# Patient Record
Sex: Male | Born: 1980 | Race: White | Hispanic: No | Smoking: Never smoker
Health system: Southern US, Community
[De-identification: ages and names within clinical notes are randomized; demographics above are authoritative.]

## PROBLEM LIST (undated history)

## (undated) DIAGNOSIS — F988 Other specified behavioral and emotional disorders with onset usually occurring in childhood and adolescence: Secondary | ICD-10-CM

## (undated) HISTORY — DX: Other specified behavioral and emotional disorders with onset usually occurring in childhood and adolescence: F98.8

## (undated) HISTORY — PX: APPENDECTOMY: SHX54

---

## 2004-11-14 ENCOUNTER — Ambulatory Visit: Payer: Self-pay | Admitting: Internal Medicine

## 2004-11-14 ENCOUNTER — Inpatient Hospital Stay (HOSPITAL_COMMUNITY): Admission: RE | Admit: 2004-11-14 | Discharge: 2004-11-22 | Payer: Self-pay | Admitting: *Deleted

## 2004-11-14 ENCOUNTER — Encounter (INDEPENDENT_AMBULATORY_CARE_PROVIDER_SITE_OTHER): Payer: Self-pay | Admitting: *Deleted

## 2004-11-14 ENCOUNTER — Encounter: Admission: RE | Admit: 2004-11-14 | Discharge: 2004-11-14 | Payer: Self-pay | Admitting: Internal Medicine

## 2004-11-28 ENCOUNTER — Ambulatory Visit (HOSPITAL_COMMUNITY): Admission: RE | Admit: 2004-11-28 | Discharge: 2004-11-28 | Payer: Self-pay | Admitting: *Deleted

## 2005-01-06 ENCOUNTER — Ambulatory Visit: Payer: Self-pay | Admitting: Internal Medicine

## 2005-07-08 IMAGING — CT CT PELVIS W/ CM
1 of 4 series · 13 of 32 positions shown, 18 images · IV contrast (omnipaque)
Comparison: 11/21/04.

CLINICAL DATA: 23 year-old male status post appendectomy with abdominal and pelvic abscesses.  Percutaneous drainage procedure was performed on 11/21/04. The patient returns for follow-up scan and re-evaluation.
TECHNIQUE: 150 cc Omnipaque 300 contrast was administered intravenously.  Helical imaging performed through the abdomen and pelvis.
CT ABDOMEN WITH CONTRAST:
Bibasilar atelectasis is noted.  No pericardial or pleural fluid.  Heart size is normal.  Liver, gallbladder, spleen, pancreas, biliary system, and kidneys are normal. Adrenal glands are also within normal limits.  There appears to be a stable calcification along the adrenal gland and IVC on image 36. Previous right upper quadrant perihepatic fluid collection has significantly decreased in diameter.  This roughly measures 20 x 17 mm, previously measuring 26 x 44 mm. Adjacent to the duodenum, there is a mesenteric focal fluid collection with peripheral enhancement now measuring 31 mm, previously measuring 29 mm.  Beneath the right rectus abdominus muscle, there is slight enlargement of a peritoneal peripheral enhancing fluid collection adjacent to the colon now measuring 27 x 25 mm, previously measuring 26 x 14 mm. Residual inflammation is noted in the right lower quadrant about the cecum.  Small amount of fluid adjacent to the surgical clips appears to be resolving as well and measures 7 mm, previously measuring 28 mm.
The right lower quadrant anterior pigtail drainage catheter is again noted.  There is no residual fluid about the catheter tip.

[Series 2: abd/pelvis 5.0 b30f · axial · 0.66mm/px · z∈[-568,-118]mm · 13 of 104 slices shown, 18 images]
[im 7/104  soft-tissue]
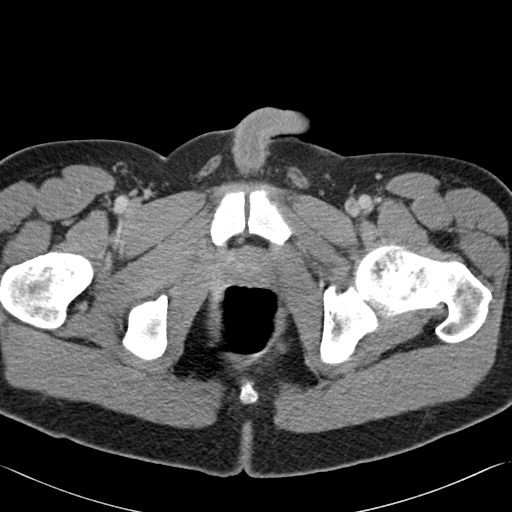
[im 7/104  bone]
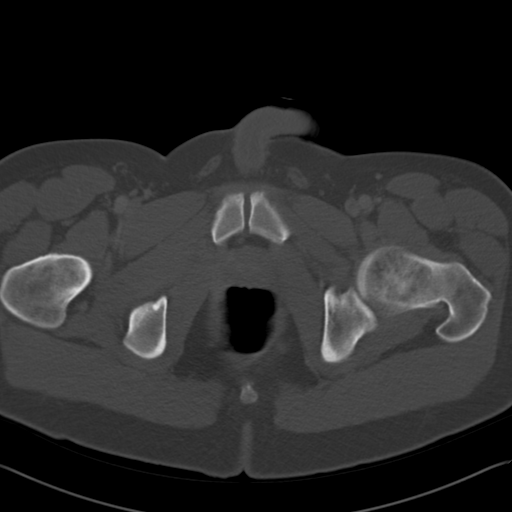
[im 14/104  soft-tissue]
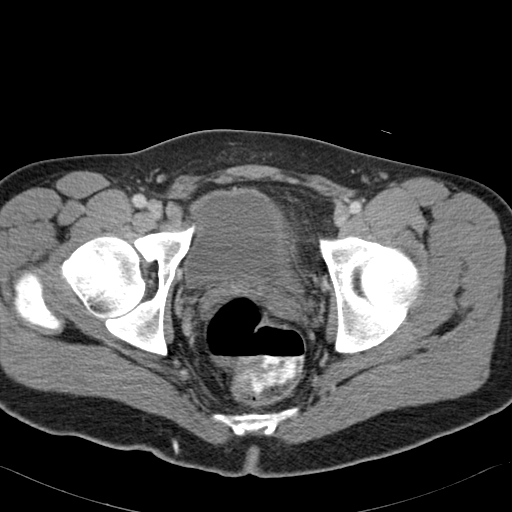
[im 21/104  soft-tissue]
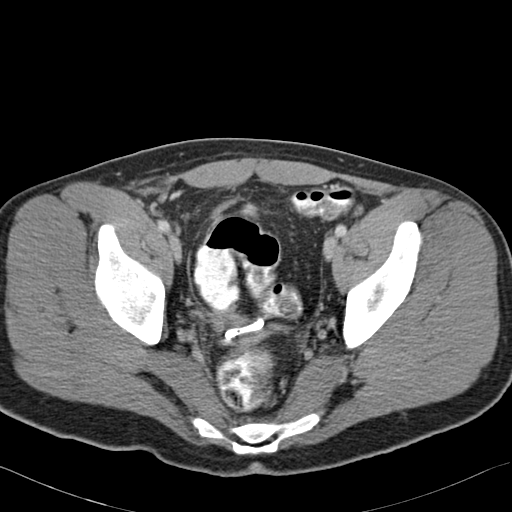
[im 35/104  soft-tissue]
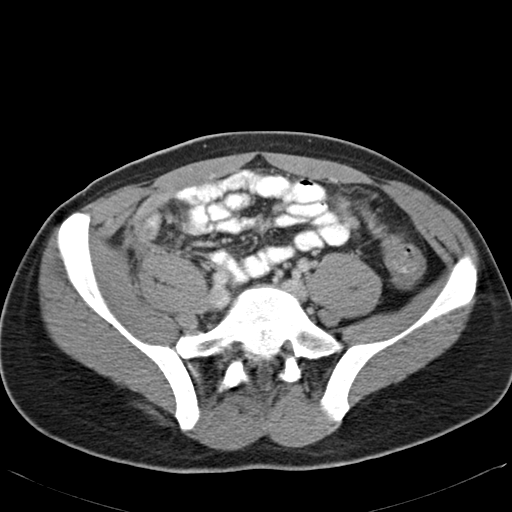
[im 42/104  soft-tissue]
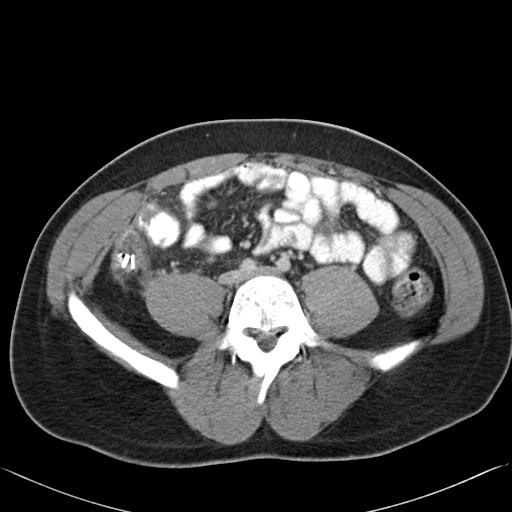
[im 49/104  soft-tissue]
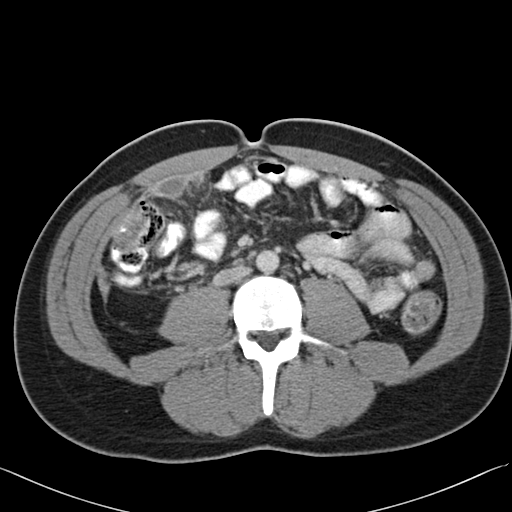
[im 55/104  soft-tissue]
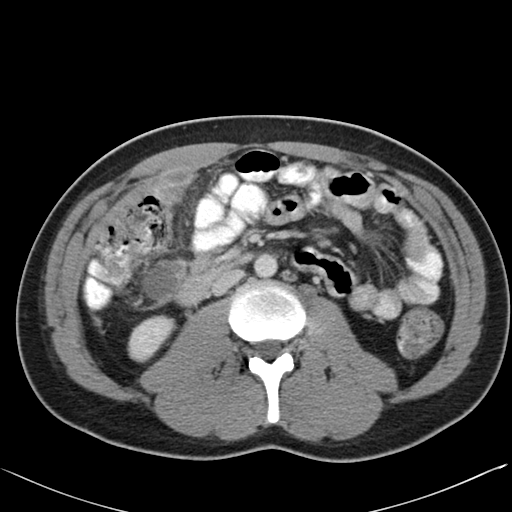
[im 62/104  soft-tissue]
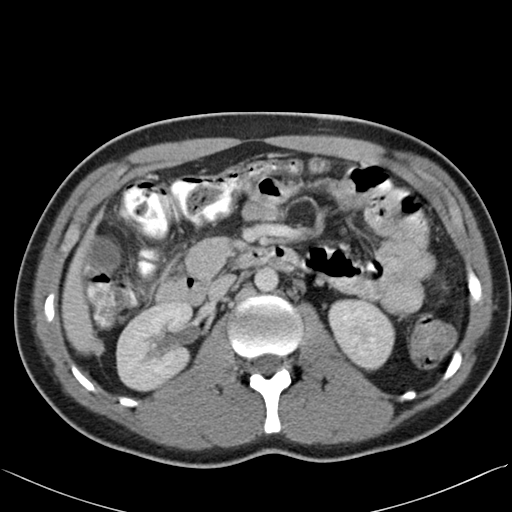
[im 69/104  soft-tissue]
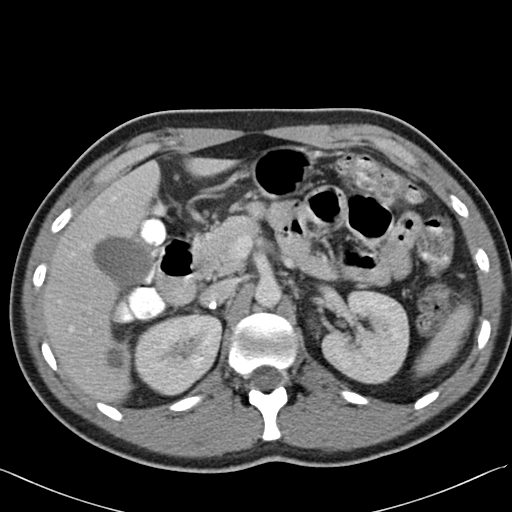
[im 69/104  bone]
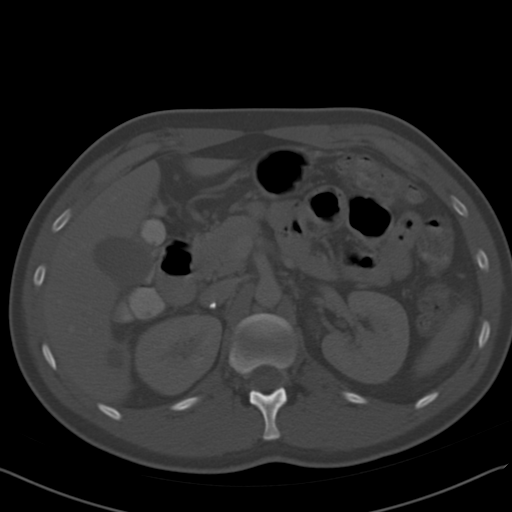
[im 76/104  lung]
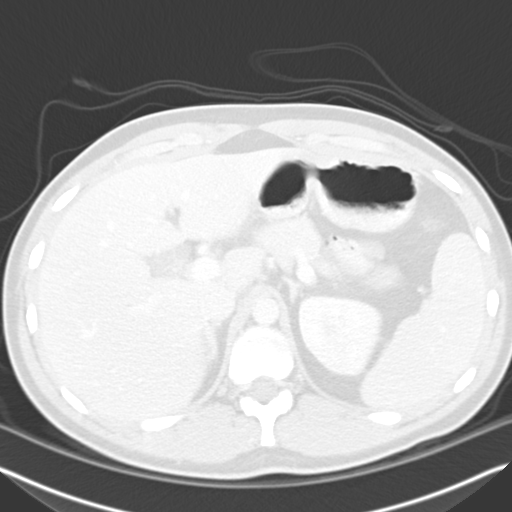
[im 83/104  soft-tissue]
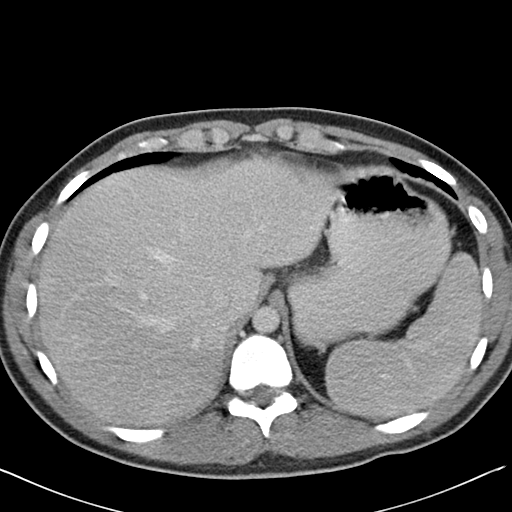
[im 83/104  lung]
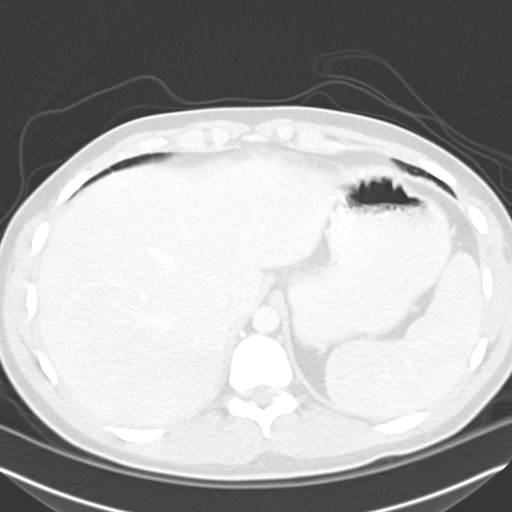
[im 90/104  soft-tissue]
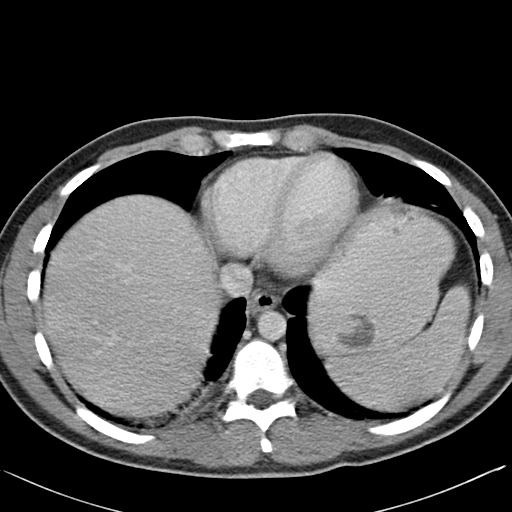
[im 90/104  lung]
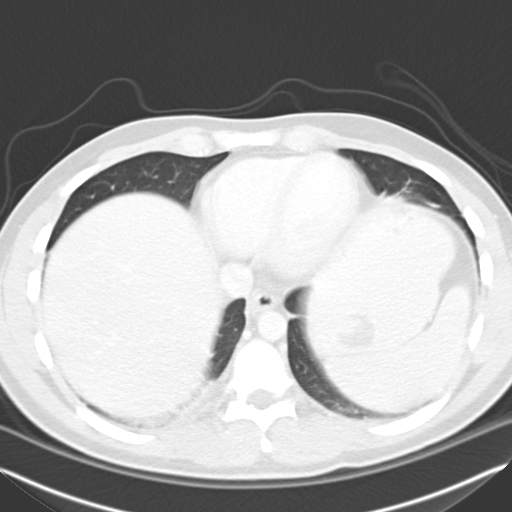
[im 97/104  soft-tissue]
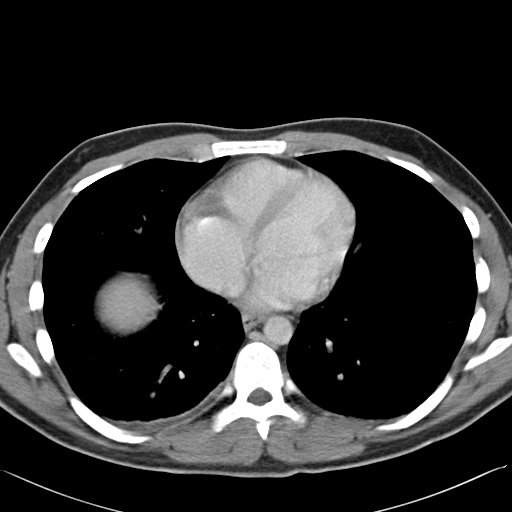
[im 97/104  lung]
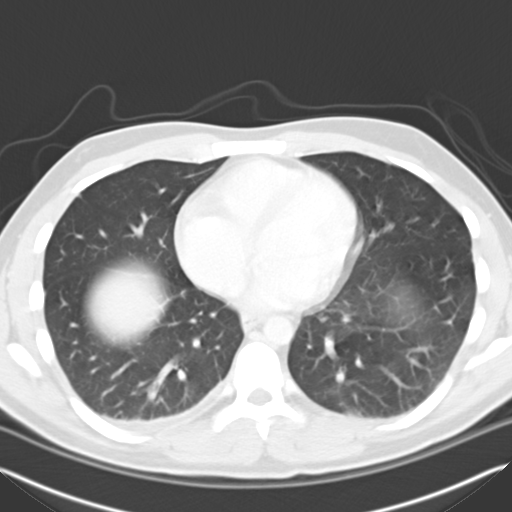

[13 of 32 positions shown; findings below may reference images not displayed]

IMPRESSION: 1.  Resolved right lower quadrant abscess following drainage catheter insertion. This drainage catheter will be removed.  
2.  Improving right inferior perihepatic fluid collection.
3.  Slight enlargement of two right abdominal fluid collections as described.
CT PELVIS WITH CONTRAST:
The right posterior transgluteal pigtail catheter is in place. There is minimal if any residual fluid in the previous pelvic collection.  Inflammation about the rectosigmoid colon and bladder has nearly resolved.  No new pelvic fluid collections.
IMPRESSION: Resolved pelvic abscess and improving adjacent inflammation.
Note: The output from both catheters has significantly decreased over the last 48 to 72 hours.  After discussion with Dr. Sandip, the two abscess drains will be removed.
Because the two right abdominal fluid collections have slightly increased in size, the patient?s white count will be checked, and if greater than 10, these will be aspirated with CT guidance.

## 2005-08-29 ENCOUNTER — Ambulatory Visit: Payer: Self-pay | Admitting: Internal Medicine

## 2006-06-08 ENCOUNTER — Ambulatory Visit: Payer: Self-pay | Admitting: Internal Medicine

## 2006-07-18 ENCOUNTER — Ambulatory Visit: Payer: Self-pay | Admitting: Internal Medicine

## 2007-04-04 ENCOUNTER — Encounter: Payer: Self-pay | Admitting: Internal Medicine

## 2007-04-04 ENCOUNTER — Telehealth (INDEPENDENT_AMBULATORY_CARE_PROVIDER_SITE_OTHER): Payer: Self-pay | Admitting: *Deleted

## 2007-04-15 ENCOUNTER — Telehealth: Payer: Self-pay | Admitting: Internal Medicine

## 2007-06-10 ENCOUNTER — Ambulatory Visit: Payer: Self-pay | Admitting: Internal Medicine

## 2007-06-10 DIAGNOSIS — F988 Other specified behavioral and emotional disorders with onset usually occurring in childhood and adolescence: Secondary | ICD-10-CM

## 2007-07-29 ENCOUNTER — Telehealth (INDEPENDENT_AMBULATORY_CARE_PROVIDER_SITE_OTHER): Payer: Self-pay | Admitting: *Deleted

## 2007-10-29 ENCOUNTER — Telehealth (INDEPENDENT_AMBULATORY_CARE_PROVIDER_SITE_OTHER): Payer: Self-pay | Admitting: *Deleted

## 2007-12-04 ENCOUNTER — Telehealth (INDEPENDENT_AMBULATORY_CARE_PROVIDER_SITE_OTHER): Payer: Self-pay | Admitting: *Deleted

## 2008-01-13 ENCOUNTER — Telehealth (INDEPENDENT_AMBULATORY_CARE_PROVIDER_SITE_OTHER): Payer: Self-pay | Admitting: *Deleted

## 2008-03-09 ENCOUNTER — Ambulatory Visit: Payer: Self-pay | Admitting: Internal Medicine

## 2008-04-20 ENCOUNTER — Telehealth (INDEPENDENT_AMBULATORY_CARE_PROVIDER_SITE_OTHER): Payer: Self-pay | Admitting: *Deleted

## 2008-05-29 ENCOUNTER — Telehealth (INDEPENDENT_AMBULATORY_CARE_PROVIDER_SITE_OTHER): Payer: Self-pay | Admitting: *Deleted

## 2008-07-13 ENCOUNTER — Telehealth (INDEPENDENT_AMBULATORY_CARE_PROVIDER_SITE_OTHER): Payer: Self-pay | Admitting: *Deleted

## 2008-08-28 ENCOUNTER — Telehealth (INDEPENDENT_AMBULATORY_CARE_PROVIDER_SITE_OTHER): Payer: Self-pay | Admitting: *Deleted

## 2008-10-13 ENCOUNTER — Telehealth (INDEPENDENT_AMBULATORY_CARE_PROVIDER_SITE_OTHER): Payer: Self-pay | Admitting: *Deleted

## 2008-12-09 ENCOUNTER — Telehealth (INDEPENDENT_AMBULATORY_CARE_PROVIDER_SITE_OTHER): Payer: Self-pay | Admitting: *Deleted

## 2008-12-15 ENCOUNTER — Ambulatory Visit: Payer: Self-pay | Admitting: Internal Medicine

## 2009-02-17 ENCOUNTER — Telehealth (INDEPENDENT_AMBULATORY_CARE_PROVIDER_SITE_OTHER): Payer: Self-pay | Admitting: *Deleted

## 2009-04-02 ENCOUNTER — Telehealth (INDEPENDENT_AMBULATORY_CARE_PROVIDER_SITE_OTHER): Payer: Self-pay | Admitting: *Deleted

## 2009-05-13 ENCOUNTER — Telehealth (INDEPENDENT_AMBULATORY_CARE_PROVIDER_SITE_OTHER): Payer: Self-pay | Admitting: *Deleted

## 2009-06-18 ENCOUNTER — Telehealth (INDEPENDENT_AMBULATORY_CARE_PROVIDER_SITE_OTHER): Payer: Self-pay | Admitting: *Deleted

## 2009-07-26 ENCOUNTER — Telehealth (INDEPENDENT_AMBULATORY_CARE_PROVIDER_SITE_OTHER): Payer: Self-pay | Admitting: *Deleted

## 2009-08-23 ENCOUNTER — Ambulatory Visit: Payer: Self-pay | Admitting: Internal Medicine

## 2010-01-07 ENCOUNTER — Telehealth (INDEPENDENT_AMBULATORY_CARE_PROVIDER_SITE_OTHER): Payer: Self-pay | Admitting: *Deleted

## 2010-02-14 ENCOUNTER — Telehealth (INDEPENDENT_AMBULATORY_CARE_PROVIDER_SITE_OTHER): Payer: Self-pay | Admitting: *Deleted

## 2010-03-28 ENCOUNTER — Telehealth (INDEPENDENT_AMBULATORY_CARE_PROVIDER_SITE_OTHER): Payer: Self-pay | Admitting: *Deleted

## 2010-04-11 ENCOUNTER — Ambulatory Visit: Payer: Self-pay | Admitting: Internal Medicine

## 2010-06-20 ENCOUNTER — Telehealth (INDEPENDENT_AMBULATORY_CARE_PROVIDER_SITE_OTHER): Payer: Self-pay | Admitting: *Deleted

## 2010-08-09 ENCOUNTER — Telehealth: Payer: Self-pay | Admitting: Internal Medicine

## 2010-08-23 ENCOUNTER — Telehealth (INDEPENDENT_AMBULATORY_CARE_PROVIDER_SITE_OTHER): Payer: Self-pay | Admitting: *Deleted

## 2010-11-08 ENCOUNTER — Telehealth: Payer: Self-pay | Admitting: Internal Medicine

## 2010-11-15 ENCOUNTER — Telehealth: Payer: Self-pay | Admitting: Internal Medicine

## 2010-11-22 NOTE — Progress Notes (Signed)
Summary: Refill Request  Phone Note Refill Request Call back at Home Phone [phone] Message from:  Patient on February 14, 2010 12:56 PM  Refills Requested: Medication #1:  ADDERALL 30 MG  TABS Take one tablet twice daily..   Dosage confirmed as above?Dosage Confirmed   Supply Requested: 1 month  Method Requested: Pick up at Office Next Appointment Scheduled: none Initial call taken by: Harold Barban,  February 14, 2010 12:57 PM    Prescriptions: ADDERALL 30 MG  TABS (AMPHETAMINE-DEXTROAMPHETAMINE) Take one tablet twice daily.  #60 x 0   Entered by:   Shary Decamp   Authorized by:   Nolon Rod. Paz MD   Signed by:   Shary Decamp on 02/14/2010   Method used:   Print then Give to Patient   RxID:   [id number]

## 2010-11-22 NOTE — Progress Notes (Signed)
Summary: REFILL  Phone Note Refill Request Call back at [phone] Message from:  Patient on August 09, 2010 4:05 PM  Refills Requested: Medication #1:  ADDERALL 30 MG  TABS Take one tablet twice daily..  Method Requested: Pick up at Office Initial call taken by: Magdalen Spatz [facility],  August 09, 2010 4:05 PM Caller: Patient Call For: Nolon Rod. Mairen Wallenstein MD  Follow-up for Phone Call        Pt is aware rx is ready.  Follow-up by: Army Fossa CMA,  August 09, 2010 4:07 PM    Prescriptions: ADDERALL 30 MG  TABS (AMPHETAMINE-DEXTROAMPHETAMINE) Take one tablet twice daily.  #60 x 0   Entered by:   Army Fossa CMA   Authorized by:   Nolon Rod. Kurstin Dimarzo MD   Signed by:   Army Fossa CMA on 08/09/2010   Method used:   Print then Give to Patient   RxID:   [id number]

## 2010-11-22 NOTE — Assessment & Plan Note (Signed)
Summary: FOR MED REFILL--PH   Vital Signs:  Patient profile:   30 year old male Height:      72 inches Weight:      212 pounds Temp:     99.3 degrees F oral Pulse rate:   66 / minute BP sitting:   140 / 78  (left arm)  Vitals Entered By: Jeremy Johann CMA (April 11, 2010 4:22 PM) CC: f/u med Comments --refill REVIEWED MED LIST, PATIENT AGREED DOSE AND INSTRUCTION CORRECT    History of Present Illness: followup on ADHD, doing well  Allergies (verified): No Known Drug Allergies  Past History:  Past Medical History: Reviewed history from 03/09/2008 and no changes required. ADD  Past Surgical History: Reviewed history from 06/10/2007 and no changes required. 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 He feels that he needs it even outside the office for instance when he plays golf .  Physical Exam  General:  alert and well-developed.   Lungs:  normal respiratory effort, no intercostal retractions, no accessory muscle use, and normal breath sounds.   Heart:  normal rate, regular rhythm, and no murmur.   Psych:  not anxious appearing and not depressed appearing.     Impression & Recommendations:  Problem # 1:  ADD (ICD-314.00) doing well defined refill  Problem # 2:  ROUTINE GENERAL MEDICAL EXAM@HEALTH  CARE FACL (ICD-V70.0) Reminded him that he needs physical from time to time. He will return to the office in 6-8 months, we are planning to do a physical then  Complete Medication List: 1)  Adderall 30 Mg Tabs (Amphetamine-dextroamphetamine) .... Take one tablet twice daily.  Patient Instructions: 1)  Please schedule a follow-up appointment in 8  months , came back fasting for a physical Prescriptions: ADDERALL 30 MG  TABS  (AMPHETAMINE-DEXTROAMPHETAMINE) Take one tablet twice daily.  #60 x 0   Entered and Authorized by:   Nolon Rod. Paz MD   Signed by:   Nolon Rod. Paz MD on 04/11/2010   Method used:   Print then Give to Patient   RxID:   [id number]

## 2010-11-22 NOTE — Progress Notes (Signed)
Summary: ADDERRAL REFILL  Phone Note Call from Patient Call back at Home Phone [phone]   Caller: Patient Summary of Call: Patient needs Adderal prescription refilled.  Advised patient that it will be ready in 24 hours for pickup unless someone calls them Initial call taken by: Jerolyn Shin,  June 20, 2010 4:34 PM  Follow-up for Phone Call        rx will be upfront for pt. Army Fossa CMA  June 20, 2010 4:49 PM     Prescriptions: ADDERALL 30 MG  TABS (AMPHETAMINE-DEXTROAMPHETAMINE) Take one tablet twice daily.  #60 x 0   Entered by:   Army Fossa CMA   Authorized by:   Nolon Rod. Paz MD   Signed by:   Army Fossa CMA on 06/20/2010   Method used:   Print then Give to Patient   RxID:   [id number]

## 2010-11-22 NOTE — Progress Notes (Signed)
Summary: refill  Phone Note Refill Request Call back at Home Phone [phone] Message from:  Patient on March 28, 2010 11:39 AM  Refills Requested: Medication #1:  ADDERALL 30 MG  TABS Take one tablet twice daily.. call when ready   Method Requested: Pick up at Office Initial call taken by: Okey Regal Spring,  March 28, 2010 11:39 AM Caller: Patient  Follow-up for Phone Call        pt aware rx ready for pick-up. after 2:30pm. Pt informed and note placed on rx OFFICE VISIT DUE NOW ................Marland KitchenFelecia Deloach CMA  March 28, 2010 12:06 PM     Prescriptions: ADDERALL 30 MG  TABS (AMPHETAMINE-DEXTROAMPHETAMINE) Take one tablet twice daily.  #60 x 0   Entered by:   Jeremy Johann CMA   Authorized by:   Nolon Rod. Paz MD   Signed by:   Jeremy Johann CMA on 03/28/2010   Method used:   Print then Give to Patient   RxID:   [id number]

## 2010-11-22 NOTE — Progress Notes (Signed)
Summary: adderall not available  Phone Note Call from Patient Call back at Home Phone [phone]   Caller: Patient Summary of Call: patient cant get adderall rx filled - was told at pharmacy there is a Sport and exercise psychologist -- what to do Initial call taken by: Okey Regal Spring,  August 23, 2010 1:25 PM  Follow-up for Phone Call        we can try Ritalin 20 mg 1 by mouth once daily #30, no RF Follow-up by: Elita Quick E. Paz MD,  August 23, 2010 5:10 PM  Additional Follow-up for Phone Call Additional follow up Details #1::        left message for pt to call back. Army Fossa CMA  August 24, 2010 8:34 AM     Additional Follow-up for Phone Call Additional follow up Details #2::    patient called back---I informed him of shortage---he says he hasnt tried anything else---told him Dr Drue Novel had alternate for him and prescription would be ready for pickup in 24 hours.Marland KitchenMarland KitchenJerolyn Shin  August 24, 2010 9:18 AM  New/Updated Medications: RITALIN 20 MG TABS (METHYLPHENIDATE HCL) 1 by mouth once daily Prescriptions: RITALIN 20 MG TABS (METHYLPHENIDATE HCL) 1 by mouth once daily  #30 x 0   Entered by:   Army Fossa CMA   Authorized by:   Nolon Rod. Paz MD   Signed by:   Army Fossa CMA on 08/24/2010   Method used:   Print then Give to Patient   RxID:   [id number]

## 2010-11-22 NOTE — Progress Notes (Signed)
Summary: refill-paz  Phone Note Refill Request Call back at Home Phone [phone] Message from:  Patient  Refills Requested: Medication #1:  ADDERALL 30 MG  TABS Take one tablet twice daily.. Initial call taken by: Jeremy Johann CMA,  January 07, 2010 4:29 PM  Follow-up for Phone Call        pt aware rx ready after 10 am on monday..rx [facility] [address] awaiting dr Drue Novel signature.......Marland KitchenFelecia Deloach CMA  January 07, 2010 4:57 PM     Prescriptions: ADDERALL 30 MG  TABS (AMPHETAMINE-DEXTROAMPHETAMINE) Take one tablet twice daily.  #60 x 0   Entered by:   Jeremy Johann CMA   Authorized by:   Nolon Rod. Paz MD   Signed by:   Jeremy Johann CMA on 01/07/2010   Method used:   Print then Give to Patient   RxID:   [id number]

## 2010-11-24 NOTE — Progress Notes (Signed)
Summary: adderral refill  Phone Note Refill Request Message from:  Patient on November 08, 2010 4:51 PM  Refills Requested: Medication #1:  ADDERRAL Patient needs Adderall prescription refilled.  Advised patient that it will be ready in 24 hours for pickup unless someone calls them  Initial call taken by: Jerolyn Shin,  November 08, 2010 4:52 PM  Follow-up for Phone Call        Okay to switch him back from Ritalin to Adderall?  Follow-up by: Army Fossa CMA,  November 09, 2010 8:03 AM  Additional Follow-up for Phone Call Additional follow up Details #1::        if available, okay to switch him back to his regular dose of adderall Additional Follow-up by: [facility] E. Paz MD,  November 09, 2010 11:22 AM    Additional Follow-up for Phone Call Additional follow up Details #2::    rx up front for pt. Army Fossa CMA  November 09, 2010 11:27 AM   New/Updated Medications: ADDERALL 10 MG TABS (AMPHETAMINE-DEXTROAMPHETAMINE) 1 by mouth two times a day. Prescriptions: ADDERALL 10 MG TABS (AMPHETAMINE-DEXTROAMPHETAMINE) 1 by mouth two times a day.  #60 x 0   Entered by:   Army Fossa CMA   Authorized by:   Nolon Rod. Paz MD   Signed by:   Army Fossa CMA on 11/09/2010   Method used:   Print then Give to Patient   RxID:   [id number]

## 2010-11-24 NOTE — Progress Notes (Signed)
Summary: Refill  Phone Note Refill Request Call back at Home Phone [phone] Message from:  Patient on November 15, 2010 11:50 AM  Refills Requested: Medication #1:  ADDERALL 10 MG TABS 1 by mouth two times a day..   Dosage confirmed as above?Dosage Confirmed   Supply Requested: 1 month Pt was given rx for 10 mg. He says that he is normally supposed to be on 30 mg. Pt has already had the rx for 10 mg filled. Please advise.  Initial call taken by: Lavell Islam,  November 15, 2010 11:51 AM  Follow-up for Phone Call        Okay to give pt an rx for 30- in looking at his history I saw 10 mg and filled that. He is on 30 mg?  Follow-up by: Army Fossa CMA,  November 15, 2010 1:21 PM  Additional Follow-up for Phone Call Additional follow up Details #1::        medication list is reviewed, before the adderall shortage his dose was  30 milligrams twice a day. ok to call adderall 30 mg one p.o. b.i.d., 60, no refills  Additional Follow-up by: Marlana Mckowen E. Zayan Delvecchio MD,  November 15, 2010 2:00 PM    Additional Follow-up for Phone Call Additional follow up Details #2::    Left message that rx was up front for him to pick up. Army Fossa CMA  November 15, 2010 2:02 PM   New/Updated Medications: ADDERALL 30 MG TABS (AMPHETAMINE-DEXTROAMPHETAMINE) 1 by mouth two times a day. Prescriptions: ADDERALL 30 MG TABS (AMPHETAMINE-DEXTROAMPHETAMINE) 1 by mouth two times a day.  #60 x 0   Entered by:   Army Fossa CMA   Authorized by:   Nolon Rod. Namiah Dunnavant MD   Signed by:   Army Fossa CMA on 11/15/2010   Method used:   Print then Give to Patient   RxID:   [id number]

## 2010-12-05 ENCOUNTER — Encounter: Payer: Self-pay | Admitting: Internal Medicine

## 2010-12-05 ENCOUNTER — Ambulatory Visit (INDEPENDENT_AMBULATORY_CARE_PROVIDER_SITE_OTHER): Payer: BC Managed Care – PPO | Admitting: Internal Medicine

## 2010-12-05 DIAGNOSIS — F988 Other specified behavioral and emotional disorders with onset usually occurring in childhood and adolescence: Secondary | ICD-10-CM

## 2010-12-05 DIAGNOSIS — L259 Unspecified contact dermatitis, unspecified cause: Secondary | ICD-10-CM | POA: Insufficient documentation

## 2010-12-14 NOTE — Assessment & Plan Note (Signed)
Summary: rash newr eye/cbs   Vital Signs:  Patient profile:   30 year old male Height:      72 inches Weight:      205.13 pounds BMI:     27.92 Temp:     98.7 degrees F oral Pulse rate:   88 / minute Pulse rhythm:   regular BP sitting:   120 / 84  (left arm) Cuff size:   large  Vitals Entered By: Army Fossa CMA (December 05, 2010 9:13 AM) CC: Rash around (L) eye x 1 month Comments comes and goes [facility] [address]   History of Present Illness: 4 weeks history of a rash close to the left eye. He has been using on and off over-the-counter steroid creams with some results. the  rash was no scaly even before the steroids. No pruritus  ROS ADHD well-controlled sleeps well No anxiety or depression   Current Medications (verified): 1)  Adderall 30 Mg Tabs (Amphetamine-Dextroamphetamine) .Marland Kitchen.. 1 By Mouth Two Times A Day.  Allergies (verified): No Known Drug Allergies  Past History:  Past Medical History: Reviewed history from 03/09/2008 and no changes required. ADD  Past Surgical History: Reviewed history from 06/10/2007 and no changes required. Appendectomy-had abscess  Social History: Married son born  ~ 60 -21 Tobacco-no ETOH-- social  works as a Teacher, music  Physical Exam  General:  alert and well-developed.  alert and well-developed.   Msk:  ha red, slightly papular patch external from the left eye, borders are well-demarcated but irregular. No scaly, no blisters. Psych:  not anxious appearing and not depressed appearing.  not anxious appearing and not depressed appearing.     Impression & Recommendations:  Problem # 1:  ECZEMA (ICD-692.9) suspect eczema although lesion is not at baseline due to recent use of steroids. Trial with betamethasone, will call if no better for a dermatology referral   His updated medication list for this problem includes:    Betamethasone Dipropionate 0.05 % Crea (Betamethasone dipropionate)  .Marland Kitchen... Apply two times a day x 10 days  Problem # 2:  ADD (ICD-314.00) well controlled, RF  Complete Medication List: 1)  Adderall 30 Mg Tabs (Amphetamine-dextroamphetamine) .Marland Kitchen.. 1 by mouth two times a day. 2)  Betamethasone Dipropionate 0.05 % Crea (Betamethasone dipropionate) .... Apply two times a day x 10 days  Patient Instructions: 1)  call in 2 weeks if the rash is no better  2)  Please schedule a follow-up appointment in 6 months .  Prescriptions: ADDERALL 30 MG TABS (AMPHETAMINE-DEXTROAMPHETAMINE) 1 by mouth two times a day.  #60 x 0   Entered and Authorized by:   Nolon Rod. Angello Chien MD   Signed by:   Nolon Rod. Melvia Matousek MD on 12/05/2010   Method used:   Print then Give to Patient   RxID:   [id number] BETAMETHASONE DIPROPIONATE 0.05 % CREA (BETAMETHASONE DIPROPIONATE) apply two times a day x 10 days  #1 x 0   Entered and Authorized by:   Nolon Rod. Piedad Standiford MD   Signed by:   Nolon Rod. Yaquelin Langelier MD on 12/05/2010   Method used:   Print then Give to Patient   RxID:   [id number]    Orders Added: 1)  Est. Patient Level III [09323]

## 2011-02-17 ENCOUNTER — Telehealth: Payer: Self-pay | Admitting: Internal Medicine

## 2011-02-17 MED ORDER — AMPHETAMINE-DEXTROAMPHETAMINE 30 MG PO TABS: 30.0000 mg | ORAL_TABLET | Freq: Two times a day (BID) | ORAL | Status: DC

## 2011-02-17 NOTE — Telephone Encounter (Signed)
Will place up front

## 2011-02-17 NOTE — Telephone Encounter (Signed)
Patient needs prescription for Adderall---advised him it would be ready Monday afternoon unless he received a phone call

## 2011-03-10 NOTE — H&P (Signed)
NAME:  Travis Dunn, Travis Dunn                ACCOUNT NO.:  [id number]   MEDICAL RECORD NO.:  [id number]          PATIENT TYPE:  AMB   LOCATION:  DAY                          FACILITY[facility]  [facility]   PHYSICIAN:  Vikki Ports, MDDATE OF BIRTH:  Jan 21, 1981   DATE OF ADMISSION:  11/14/2004  DATE OF DISCHARGE:                                HISTORY & PHYSICAL   ADMISSION DIAGNOSIS:  Acute appendicitis.   CONDITION ON ADMISSION:  Stable.   REFERRING PHYSICIAN:  Wanda Plump, MD from [facility].   HISTORY OF PRESENT ILLNESS:  Patient is a very pleasant 30 year old who  three days ago started having periumbilical abdominal pain which localized  to his suprapubic region into the right lower quadrant.  He was seen by Dr.  Drue Novel, who ordered a CT scan of the abdomen, which was consistent with acute  appendicitis.  Patient had a white count of 16,000.  Patient was sent here  for further evaluation and definitive laparoscopic appendectomy.   PAST MEDICAL HISTORY:  None.   PAST SURGICAL HISTORY:  None.   He has no known drug allergies.   He takes no medications.   PHYSICAL EXAMINATION:  VITAL SIGNS:  Temperature 98.6, heart rate 74, blood  pressure 125/70.  GENERAL:  An age-appropriate white male in moderate distress.  HEENT:  Benign.  Normocephalic and atraumatic.  Pupils are equal, round and  reactive to light.  NECK:  Soft and supple without thyromegaly or cervical adenopathy.  LUNGS:  Clear to auscultation and percussion x2.  HEART:  Regular rate and rhythm without murmurs, rubs or gallops.  ABDOMEN:  Soft but tender in the right lower quadrant with obvious rebound  and localized peritonitis in the right lower quadrant.  EXTREMITIES:  No clubbing, cyanosis or edema.   IMPRESSION:  Acute appendicitis, based on clinical exam and CT scan.   PLAN:  Laparoscopic appendectomy.    KRH/MEDQ  D:  11/14/2004  T:  11/14/2004  Job:  [id number]

## 2011-03-10 NOTE — Op Note (Signed)
NAME:  Travis Dunn, Travis Dunn                ACCOUNT NO.:  [id number]   MEDICAL RECORD NO.:  [id number]          PATIENT TYPE:  OBV   LOCATION:  0469                         FACILITY:  [facility]   PHYSICIAN:  Vikki Ports, MDDATE OF BIRTH:  May 22, 1981   DATE OF PROCEDURE:  11/15/2004  DATE OF DISCHARGE:                                 OPERATIVE REPORT   PREOPERATIVE DIAGNOSIS:  Acute appendicitis.   POSTOPERATIVE DIAGNOSIS:  Gangrenous appendicitis.   PROCEDURE:  Laparoscopic appendectomy.   SURGEON:  Danna Hefty, M.D.   ANESTHESIA:  General.   DESCRIPTION OF PROCEDURE:  The patient was taken to the operating room and  placed in a supine position.  After adequate general anesthesia was induced  using endotracheal tube, the Foley catheter was placed, and the abdomen was  prepped and draped in the normal sterile fashion.  A  12 mm OptiView trocar  was placed in the left upper quadrant under direct visualization.  An  additional 12 mm trocar was placed in the left lower quadrant, 5 mm trocar  placed in the left abdomen.  The appendix was identified, was somewhat  retrocecal and was mobilized.  The mesoappendix was taken down with the  harmonic scalpel.  There was a moderate amount of bleeding from the  appendiceal artery, but this was controlled with harmonic scalpel.  The mid  portion and tip of the appendix appeared very edematous, but there was no  obvious evidence of rupture.  On grasping the appendix, however, there was  some contents of the appendix spilled.  The base of the appendix was clean  and was transected using an EndoGIA stapling device.  Staple line was  intact.  The appendix was placed in an EndoCatch bag and removed through the  left lower quadrant port.  Copious irrigation was then performed in the  right lower quadrant.  Pneumoperitoneum was released.  Trocars were removed.  Skin incisions were closed with subcuticular 4-0 Monocryl.  Steri-Strips and  sterile  dressings were applied.  The patient tolerated the procedure well  went to PACU in good condition.      KRH/MEDQ  D:  11/15/2004  T:  11/15/2004  Job:  [id number]

## 2011-03-10 NOTE — Discharge Summary (Signed)
NAME:  Travis Dunn, Travis Dunn                ACCOUNT NO.:  [id number]   MEDICAL RECORD NO.:  [id number]          PATIENT TYPE:  INP   LOCATION:  0469                         FACILITY:  [facility]   PHYSICIAN:  Thornton Park. Daphine Deutscher, MD  DATE OF BIRTH:  01/13/1981   DATE OF ADMISSION:  11/14/2004  DATE OF DISCHARGE:                                 DISCHARGE SUMMARY   ADMITTING DIAGNOSIS:  Acute appendicitis.   DISCHARGE DIAGNOSIS:  Acute appendicitis with rupture.   COURSE IN THE HOSPITAL:  Travis Dunn is a 30 year old male who presented  with a 3-day history of periumbilical pain localizing into the right lower  quadrant.  The patient had a CT scan which appeared to be acute appendicitis  and was brought to [facility] for a laparoscopic appendectomy.   At the time of his procedure which was done that day - November 14, 2004 - he  had a gangrenous ruptured appendix and underwent a laparoscopic  appendectomy.  Postoperatively, he had temperatures to 103 and a CBC showed  his white count by postoperative day #3 was in the 19,000 range.  He was  continued on IV antibiotics and had an ileus with low-grade fevers and he  was observed expectantly, continuing on antibiotics.  When his ileus  appeared to be resolving by January 28 he was started on some clear liquids  and a CT scan was ordered to rule out an abscess because of his elevated  white count, and this was done on January 30 showing two abscesses.  Two  intraabdominal abscesses, one in the right lower quadrant was drained with a  10 French catheter, returning 20 mL of purulent material.  There was a deep  pelvic abscess drained with a 10 [place] via the right transgluteal approach  revealing 150 mL of purulent material.  These were sent for gram stain and  culture, both of which are now pending.  The patient by postoperative day #8  was taught how to manage his drains.  He wanted to go home.  White count was  checked on postoperative day #8  - November 22, 2004 - and it was 16,000.  Hemoglobin had been stable.  He was made ready for discharge.  He was given  Darovcet-N 100 to take for pain and Augmentin 875 to take twice a day for 7  days.  He was encouraged to return to see Dr. Luan Pulling in the office  within the week.   FINAL DIAGNOSIS:  Ruptured appendix status post laparoscopic appendectomy  complicated by intraabdominal abscesses (x2) status post percutaneous  drainage.      MBM/MEDQ  D:  11/22/2004  T:  11/22/2004  Job:  [id number]   cc:   [facility]   Wanda Plump, MD [facility]  [phone] [place], [place] [address]

## 2011-03-27 ENCOUNTER — Telehealth: Payer: Self-pay | Admitting: Internal Medicine

## 2011-03-27 MED ORDER — AMPHETAMINE-DEXTROAMPHETAMINE 30 MG PO TABS: 30.0000 mg | ORAL_TABLET | Freq: Two times a day (BID) | ORAL | Status: DC

## 2011-03-27 NOTE — Telephone Encounter (Signed)
Do Paz- do you have note?  In looks as if pt is due for appt- okay to fill adderall?

## 2011-03-27 NOTE — Telephone Encounter (Signed)
I spoke w/ pt he is aware, he will drop off another note from his Counselor.

## 2011-03-27 NOTE — Telephone Encounter (Signed)
Refill adderall/ patient will pick up [id number]  --- patient  asked about letter from counslor - that he dropped off last week

## 2011-03-27 NOTE — Telephone Encounter (Signed)
Ok 60, no RF Next OV ~ 05-2011 I don't see a counselor's note

## 2011-04-05 ENCOUNTER — Encounter: Payer: Self-pay | Admitting: Internal Medicine

## 2011-04-06 ENCOUNTER — Encounter: Payer: Self-pay | Admitting: Internal Medicine

## 2011-04-06 ENCOUNTER — Ambulatory Visit (INDEPENDENT_AMBULATORY_CARE_PROVIDER_SITE_OTHER): Payer: BC Managed Care – PPO | Admitting: Internal Medicine

## 2011-04-06 DIAGNOSIS — F419 Anxiety disorder, unspecified: Secondary | ICD-10-CM

## 2011-04-06 DIAGNOSIS — F988 Other specified behavioral and emotional disorders with onset usually occurring in childhood and adolescence: Secondary | ICD-10-CM

## 2011-04-06 DIAGNOSIS — F411 Generalized anxiety disorder: Secondary | ICD-10-CM

## 2011-04-06 MED ORDER — ESCITALOPRAM OXALATE 10 MG PO TABS: 10.0000 mg | ORAL_TABLET | Freq: Every day | ORAL | Status: DC

## 2011-04-06 NOTE — Assessment & Plan Note (Addendum)
Patient has anxiety and depression rec to continue working with the social worker who is doing marriage counseling. I told the patient that SSRIs seems to be a good option for him. I think Lexapro is a good choice, I discussed with him potential suicidal thoughts. He agreed to proceed, he agreed to continue seeing the counselor. Recheck in 6 weeks

## 2011-04-06 NOTE — Progress Notes (Signed)
  Subjective:    Patient ID: Travis Dunn, male    DOB: 18-Sep-1981, 30 y.o.   MRN: [id number]  HPI I received a letter from a counselor, he has been evaluated due to a recent marital separation., In the opinion of their counselor he may need some medication to help with anxiety. Patient reports a long history of marital  problems, they separated 3 months ago. Started counseling with Mrs. Nolen Mu about 4 weeks ago. As far as his mood  he feels on and off anxious or depressed and often times unmotivated.  Past Medical History  Diagnosis Date  . ADD (attention deficit disorder)    Past Surgical History  Procedure Date  . Appendectomy     has abscess    Review of Systems Denies suicidal or homicidal ideas He has never tried any SSRI or similar medicines They have a 17 month old baby. Does not smoke, drinks socially, denies the use of drug    Objective:   Physical Exam Alert, oriented x3, slightly anxious, not depressed appearing. Coherent and cooperative       Assessment & Plan:  Today , I spent more than 15 min with the patient, >50% of the time counseling

## 2011-04-06 NOTE — Assessment & Plan Note (Signed)
ADD was better controlled when he was less anxious, anticipate return to optimal control once anxiety is better

## 2011-05-10 ENCOUNTER — Other Ambulatory Visit: Payer: Self-pay | Admitting: Internal Medicine

## 2011-05-10 MED ORDER — AMPHETAMINE-DEXTROAMPHETAMINE 30 MG PO TABS: 30.0000 mg | ORAL_TABLET | Freq: Two times a day (BID) | ORAL | Status: DC

## 2011-05-10 NOTE — Telephone Encounter (Signed)
Refill adderall - patient will pick up thrusday afternoon [id number]

## 2011-05-10 NOTE — Telephone Encounter (Signed)
Placed up front

## 2011-06-16 ENCOUNTER — Other Ambulatory Visit: Payer: Self-pay | Admitting: Internal Medicine

## 2011-06-16 NOTE — Telephone Encounter (Signed)
By Zada Finders Hoff:06/16/2011 11:55 AM  Phone (Incoming) Travis, Dunn (Self) [phone] (H) 1)--needs prescription for Adderral 2) pt says Lexapro is not working--would like something different---wants to pick up both prescriptions on Monday at noon--if not available, please call him

## 2011-06-19 MED ORDER — SERTRALINE HCL 50 MG PO TABS: ORAL_TABLET | ORAL | Status: DC

## 2011-06-19 MED ORDER — AMPHETAMINE-DEXTROAMPHETAMINE 30 MG PO TABS: 30.0000 mg | ORAL_TABLET | Freq: Two times a day (BID) | ORAL | Status: DC

## 2011-06-19 NOTE — Telephone Encounter (Signed)
Pt aware rx ready for pick up. 

## 2011-06-19 NOTE — Telephone Encounter (Signed)
1. Ok call adderall  #60, 0 RF 2. discontinue Lexapro, start Zoloft 50 mg one tablet daily for one week, then 2 tablets daily. Call #60, no refills. 3. Followup here in one month, call anytime if   side effects from the new medication.

## 2011-07-18 ENCOUNTER — Other Ambulatory Visit: Payer: Self-pay | Admitting: Internal Medicine

## 2011-07-18 NOTE — Telephone Encounter (Signed)
Last OV 04/06/11. Last filled 06/19/11

## 2011-07-20 ENCOUNTER — Other Ambulatory Visit: Payer: Self-pay | Admitting: Internal Medicine

## 2011-07-20 NOTE — Telephone Encounter (Signed)
Adderall request [last refill 06/19/11 #60x0] Zoloft request [last refill 06/19/11 #60x0]

## 2011-07-20 NOTE — Telephone Encounter (Signed)
Patient needs refill adderall 30 mg twice a day -- zolopf  patient will pick both rx  up Friday [id number] - pharmacy never faxed refill request

## 2011-07-21 MED ORDER — SERTRALINE HCL 50 MG PO TABS: ORAL_TABLET | ORAL | Status: DC

## 2011-07-21 MED ORDER — AMPHETAMINE-DEXTROAMPHETAMINE 30 MG PO TABS: 30.0000 mg | ORAL_TABLET | Freq: Two times a day (BID) | ORAL | Status: DC

## 2011-07-21 NOTE — Telephone Encounter (Signed)
Needs OV, call 1 month supply of each, no more RF w/o OV

## 2011-07-21 NOTE — Telephone Encounter (Signed)
Done. Ready for P/U. Patient Informed.

## 2011-07-21 NOTE — Telephone Encounter (Signed)
RX Done awaiting MD signature.

## 2011-07-25 NOTE — Telephone Encounter (Signed)
1. What med is he requesting, adderall? Zoloft? 2. Needs OV as well

## 2011-07-26 NOTE — Telephone Encounter (Signed)
He requested both but they were done on 07/21/11 by Jasmine December

## 2011-07-27 NOTE — Telephone Encounter (Signed)
Noted  

## 2011-08-18 ENCOUNTER — Other Ambulatory Visit: Payer: Self-pay | Admitting: Internal Medicine

## 2011-08-21 NOTE — Telephone Encounter (Signed)
Last OV 04/06/11. Last filled 07/21/11

## 2011-08-23 NOTE — Telephone Encounter (Signed)
Denied, see last RF

## 2011-08-24 ENCOUNTER — Other Ambulatory Visit: Payer: Self-pay | Admitting: Internal Medicine

## 2011-08-25 NOTE — Telephone Encounter (Signed)
Denied, see 08-18-11 RF

## 2011-08-25 NOTE — Telephone Encounter (Signed)
Request denied by MD. Pt's mom became aware of this when she came in to pick it up

## 2011-08-28 ENCOUNTER — Other Ambulatory Visit: Payer: Self-pay | Admitting: Internal Medicine

## 2011-09-04 ENCOUNTER — Encounter: Payer: Self-pay | Admitting: Internal Medicine

## 2011-09-04 ENCOUNTER — Ambulatory Visit (INDEPENDENT_AMBULATORY_CARE_PROVIDER_SITE_OTHER): Payer: BC Managed Care – PPO | Admitting: Internal Medicine

## 2011-09-04 VITALS — BP 118/74 | HR 98 | Temp 98.3°F | Resp 18 | Ht 72.05 in | Wt 182.2 lb

## 2011-09-04 DIAGNOSIS — Z23 Encounter for immunization: Secondary | ICD-10-CM

## 2011-09-04 DIAGNOSIS — F411 Generalized anxiety disorder: Secondary | ICD-10-CM

## 2011-09-04 DIAGNOSIS — F988 Other specified behavioral and emotional disorders with onset usually occurring in childhood and adolescence: Secondary | ICD-10-CM

## 2011-09-04 DIAGNOSIS — F419 Anxiety disorder, unspecified: Secondary | ICD-10-CM

## 2011-09-04 MED ORDER — AMPHETAMINE-DEXTROAMPHETAMINE 30 MG PO TABS: 30.0000 mg | ORAL_TABLET | Freq: Two times a day (BID) | ORAL | Status: DC

## 2011-09-04 MED ORDER — SERTRALINE HCL 100 MG PO TABS: 150.0000 mg | ORAL_TABLET | Freq: Every day | ORAL | Status: DC

## 2011-09-04 NOTE — Assessment & Plan Note (Signed)
RF medicines, stable

## 2011-09-04 NOTE — Assessment & Plan Note (Addendum)
Since the last office visit was switched from Lexapro to Zoloft 100 mg, anxiety better but not as well-controlled as he would like to, he is still separated from his wife. . Patient is counseled ; I recommend to continue seeing his counselor Plan; Increase dose of Zoloft to 150 mg, followup in 4 months, sooner if needed

## 2011-09-04 NOTE — Progress Notes (Signed)
  Subjective:    Patient ID: Travis Dunn, male    DOB: October 15, 1981, 30 y.o.   MRN: [id number]  HPI Followup from previous visit Currently on Zoloft 100 mg for anxiety, symptoms not as well-controlled as he would like. See assessment and plan.  Past Medical History  Diagnosis Date  . ADD (attention deficit disorder)       Review of Systems ADHD well-controlled with present medications. No problems with insomnia No thoughts of violence at all.     Objective:   Physical Exam  Alert, oriented x3, no apparent distress. Emotionally he seems to be doing okay.    Assessment & Plan:

## 2011-10-09 ENCOUNTER — Telehealth: Payer: Self-pay | Admitting: Internal Medicine

## 2011-10-09 MED ORDER — AMPHETAMINE-DEXTROAMPHETAMINE 30 MG PO TABS: 30.0000 mg | ORAL_TABLET | Freq: Two times a day (BID) | ORAL | Status: DC

## 2011-10-09 NOTE — Telephone Encounter (Signed)
Refill adderall - patient will pick up Tuesday [id number]

## 2011-10-09 NOTE — Telephone Encounter (Signed)
Rx done placed up front for pick-up

## 2011-11-23 ENCOUNTER — Telehealth: Payer: Self-pay | Admitting: Internal Medicine

## 2011-11-23 NOTE — Telephone Encounter (Signed)
Patient would like a new prescription for adderall

## 2011-11-23 NOTE — Telephone Encounter (Signed)
Ok 60, no RF 

## 2011-11-23 NOTE — Telephone Encounter (Signed)
OK to refill

## 2011-11-24 MED ORDER — AMPHETAMINE-DEXTROAMPHETAMINE 30 MG PO TABS: 30.0000 mg | ORAL_TABLET | Freq: Two times a day (BID) | ORAL | Status: DC

## 2011-11-24 NOTE — Telephone Encounter (Signed)
Refill done.  

## 2011-12-27 ENCOUNTER — Other Ambulatory Visit: Payer: Self-pay

## 2011-12-27 NOTE — Telephone Encounter (Signed)
Patient was last seen 09/04/11 and last refilled 11/24/11.

## 2011-12-28 NOTE — Telephone Encounter (Signed)
Ok 60, no RF 

## 2011-12-29 MED ORDER — AMPHETAMINE-DEXTROAMPHETAMINE 30 MG PO TABS: 30.0000 mg | ORAL_TABLET | Freq: Two times a day (BID) | ORAL | Status: DC

## 2011-12-29 NOTE — Telephone Encounter (Signed)
Pt aware Rx ready for pick up 

## 2012-01-29 ENCOUNTER — Other Ambulatory Visit: Payer: Self-pay | Admitting: *Deleted

## 2012-01-29 MED ORDER — AMPHETAMINE-DEXTROAMPHETAMINE 30 MG PO TABS: 30.0000 mg | ORAL_TABLET | Freq: Two times a day (BID) | ORAL | Status: DC

## 2012-01-29 NOTE — Telephone Encounter (Signed)
Left Pt detail message Rx ready for pick up after 9 am on tomorrow.

## 2012-01-29 NOTE — Telephone Encounter (Signed)
Addended by: Candie Echevaria L on: 01/29/2012 05:08 PM   Modules accepted: Orders

## 2012-03-04 ENCOUNTER — Other Ambulatory Visit: Payer: Self-pay | Admitting: *Deleted

## 2012-03-04 MED ORDER — AMPHETAMINE-DEXTROAMPHETAMINE 30 MG PO TABS: 30.0000 mg | ORAL_TABLET | Freq: Two times a day (BID) | ORAL | Status: DC

## 2012-03-04 NOTE — Telephone Encounter (Signed)
Pt aware Rx ready for pickup and 6 month f/u due. 

## 2012-04-08 ENCOUNTER — Telehealth: Payer: Self-pay | Admitting: Internal Medicine

## 2012-04-08 MED ORDER — AMPHETAMINE-DEXTROAMPHETAMINE 30 MG PO TABS: 30.0000 mg | ORAL_TABLET | Freq: Two times a day (BID) | ORAL | Status: DC

## 2012-04-08 NOTE — Telephone Encounter (Signed)
Will refill 60, has appointment with me soon

## 2012-04-08 NOTE — Telephone Encounter (Signed)
Ok to refill 

## 2012-04-08 NOTE — Telephone Encounter (Signed)
Pt would like rx for adderall 30mg . Call [phone] when ready.

## 2012-04-08 NOTE — Telephone Encounter (Signed)
Notified pt ready to pick up

## 2012-04-11 ENCOUNTER — Encounter: Payer: Self-pay | Admitting: Internal Medicine

## 2012-04-11 ENCOUNTER — Ambulatory Visit (INDEPENDENT_AMBULATORY_CARE_PROVIDER_SITE_OTHER): Payer: BC Managed Care – PPO | Admitting: Internal Medicine

## 2012-04-11 VITALS — BP 130/82 | HR 64 | Temp 98.4°F | Wt 194.0 lb

## 2012-04-11 DIAGNOSIS — F988 Other specified behavioral and emotional disorders with onset usually occurring in childhood and adolescence: Secondary | ICD-10-CM

## 2012-04-11 DIAGNOSIS — F411 Generalized anxiety disorder: Secondary | ICD-10-CM

## 2012-04-11 DIAGNOSIS — F419 Anxiety disorder, unspecified: Secondary | ICD-10-CM

## 2012-04-11 NOTE — Progress Notes (Signed)
  Subjective:    Patient ID: Travis Dunn, male    DOB: April 18, 1981, 31 y.o.   MRN: [id number]  HPI Routine office visit ADD well controlled with current medications. As far as anxiety, he self discontinue SSRIs a while back, anxiety is "okay".  Past Medical History  Diagnosis Date  . ADD (attention deficit disorder)    Past Surgical History  Procedure Date  . Appendectomy     has abscess   History   Social History  . Marital Status: Married    Spouse Name: N/A    Number of Children: 1  . Years of Education: N/A   Occupational History  . Radio producer company    Social History Main Topics  . Smoking status: Never Smoker   . Smokeless tobacco: Never Used  . Alcohol Use: Yes     social  . Drug Use: Not on file  . Sexually Active: Not on file   Other Topics Concern  . Not on file   Social History Narrative   Separated from his wife as off 6- [phone], has a 23 year old son    Review of Systems Denies insomnia Denies depression Still separated from his wife and that seems to be a permanent decision. He has a 39-year-old boy, he is doing okay. Currently not doing counseling anymore    Objective:   Physical Exam  General -- alert, well-developed, and well-nourished.   Lungs -- normal respiratory effort, no intercostal retractions, no accessory muscle use, and normal breath sounds.   Heart-- normal rate, regular rhythm, no murmur, and no gallop.   Neurologic-- alert & oriented X3 and strength normal in all extremities. Psych-- Cognition and judgment appear intact. Alert and cooperative with normal attention span and concentration.  not anxious appearing and not depressed appearing.       Assessment & Plan:

## 2012-04-11 NOTE — Assessment & Plan Note (Signed)
Well-controlled, refill medications as needed. Next visit in 6-8 months

## 2012-04-11 NOTE — Assessment & Plan Note (Signed)
Still separated from his wife, that seems to be a permanent decision. Currently on no medications, doing okay.

## 2012-04-11 NOTE — Patient Instructions (Addendum)
Come back in 6 to 8 months for a check up , we could do a physical as well

## 2012-05-21 ENCOUNTER — Other Ambulatory Visit: Payer: Self-pay | Admitting: *Deleted

## 2012-05-21 MED ORDER — AMPHETAMINE-DEXTROAMPHETAMINE 30 MG PO TABS: 30.0000 mg | ORAL_TABLET | Freq: Two times a day (BID) | ORAL | Status: DC

## 2012-05-21 NOTE — Telephone Encounter (Signed)
Left Pt detail message Rx ready for pickup 

## 2012-06-25 ENCOUNTER — Other Ambulatory Visit: Payer: Self-pay | Admitting: *Deleted

## 2012-06-25 MED ORDER — AMPHETAMINE-DEXTROAMPHETAMINE 30 MG PO TABS: 30.0000 mg | ORAL_TABLET | Freq: Two times a day (BID) | ORAL | Status: DC | PRN

## 2012-06-25 NOTE — Telephone Encounter (Signed)
Pt called in to request refill for Adderall noted last OV 04-11-12 last refill 05-21-12 #60 no refills

## 2012-06-25 NOTE — Telephone Encounter (Signed)
Placed signed RX at front desk for pt pick up, pt aware

## 2012-06-25 NOTE — Telephone Encounter (Signed)
done

## 2012-07-26 ENCOUNTER — Other Ambulatory Visit: Payer: Self-pay

## 2012-07-26 MED ORDER — AMPHETAMINE-DEXTROAMPHETAMINE 30 MG PO TABS: 30.0000 mg | ORAL_TABLET | Freq: Two times a day (BID) | ORAL | Status: DC | PRN

## 2012-07-26 NOTE — Telephone Encounter (Signed)
Last OV 04/21/12 Last fill: amphetamine-dextroamphetamine (ADDERALL) 30 MG tablet 60 tablet 0 06/25/2012 06/25/2013 Sig - Route: Take 1 tablet (30 mg total) by mouth 2 (two) times daily as needed. - Oral   Plz advise       MW

## 2012-07-26 NOTE — Telephone Encounter (Signed)
done

## 2012-09-03 ENCOUNTER — Other Ambulatory Visit: Payer: Self-pay

## 2012-09-03 MED ORDER — AMPHETAMINE-DEXTROAMPHETAMINE 30 MG PO TABS: 30.0000 mg | ORAL_TABLET | Freq: Two times a day (BID) | ORAL | Status: DC | PRN

## 2012-09-03 NOTE — Telephone Encounter (Signed)
done

## 2012-09-03 NOTE — Telephone Encounter (Signed)
OV 04/11/12 Last filled 07/26/12 #60 no refills  Plz advise     MW

## 2012-10-09 ENCOUNTER — Encounter: Payer: Self-pay | Admitting: *Deleted

## 2012-10-09 ENCOUNTER — Other Ambulatory Visit: Payer: Self-pay

## 2012-10-09 MED ORDER — AMPHETAMINE-DEXTROAMPHETAMINE 30 MG PO TABS: 30.0000 mg | ORAL_TABLET | Freq: Two times a day (BID) | ORAL | Status: DC | PRN

## 2012-10-09 NOTE — Telephone Encounter (Signed)
Rx refill request for Adderall. Please advise      KP

## 2012-10-09 NOTE — Telephone Encounter (Signed)
Pt made aware ready for pick up 

## 2012-10-09 NOTE — Telephone Encounter (Signed)
Advise patient: Travis Dunn to refill When he come to pick up prescription, he needs to sign the controlled substance agreement and give a urine sample. Please explain this is our new policy.

## 2012-11-15 ENCOUNTER — Telehealth: Payer: Self-pay | Admitting: *Deleted

## 2012-11-15 MED ORDER — AMPHETAMINE-DEXTROAMPHETAMINE 30 MG PO TABS: 30.0000 mg | ORAL_TABLET | Freq: Two times a day (BID) | ORAL | Status: DC | PRN

## 2012-11-15 NOTE — Telephone Encounter (Signed)
Pt made aware rx is ready to be picked up at front desk & needs to schedule OV.

## 2012-11-15 NOTE — Telephone Encounter (Signed)
Patient called would like refill on adderall. Last OV 04/11/2012, last fill 10/09/12 #60. Okay to refill? Please advise.

## 2012-11-15 NOTE — Telephone Encounter (Signed)
Ok RF Advise patient: He is due for a OV ~ next month

## 2012-11-19 ENCOUNTER — Ambulatory Visit (INDEPENDENT_AMBULATORY_CARE_PROVIDER_SITE_OTHER): Payer: BC Managed Care – PPO | Admitting: Internal Medicine

## 2012-11-19 VITALS — BP 114/78 | HR 103 | Temp 99.0°F | Wt 195.0 lb

## 2012-11-19 DIAGNOSIS — J358 Other chronic diseases of tonsils and adenoids: Secondary | ICD-10-CM

## 2012-11-19 MED ORDER — AMOXICILLIN 500 MG PO CAPS: 1000.0000 mg | ORAL_CAPSULE | Freq: Two times a day (BID) | ORAL | Status: DC

## 2012-11-19 NOTE — Patient Instructions (Addendum)
Rest, fluids , tylenol If cough, take Mucinex DM twice a day as needed   Take the antibiotic as prescribed  (Amoxicillin) Call if no better in few days Call anytime if the symptoms are severe, you have high fever,  increased sore throat or difficulty swallowing

## 2012-11-19 NOTE — Progress Notes (Signed)
  Subjective:    Patient ID: Travis Dunn, male    DOB: Feb 21, 1981, 32 y.o.   MRN: [id number]  HPI Symptoms started yesterday: Fever up to 102.0, sore throat at least moderate in intensity, body aches.  Past Medical History  Diagnosis Date  . ADD (attention deficit disorder)    Past Surgical History  Procedure Date  . Appendectomy     has abscess     Review of Systems Denies any headaches or a rash. Mild cough and sinus congestion Denies nausea, vomiting, diarrhea. No recent sick contacts that he knows of.    Objective:   Physical Exam General -- alert, well-developed, and well-nourished.   Neck -- is a slightly tender lymphadenopathy on the left side, around 1.5 cm, not fluctuant HEENT -- TMs bulge without redness bilaterally, throat + redness; tonsils are symmetric and normal size but quite rate and + discharge. Uvula midline, symmetric throat face symmetric and not tender to palpation Lungs -- normal respiratory effort, no intercostal retractions, no accessory muscle use, and normal breath sounds.   Heart-- normal rate, regular rhythm, no murmur, and no gallop.   Neurologic-- alert & oriented X3 and strength normal in all extremities. Psych-- Cognition and judgment appear intact. Alert and cooperative with normal attention span and concentration.  not anxious appearing and not depressed appearing.       Assessment & Plan:   Tonsillitis, Presents with sore throat fever and myalgias, rapid a strep test negative, flu test negative. Given physical exam, he has tonsillitis and I elected to Rx  Amoxicillin. See instructions

## 2012-11-20 ENCOUNTER — Encounter: Payer: Self-pay | Admitting: Internal Medicine

## 2012-11-20 LAB — POCT RAPID STREP A (OFFICE): Rapid Strep A Screen: NEGATIVE

## 2012-11-20 NOTE — Addendum Note (Signed)
Addended by: Edwena Felty T on: 11/20/2012 10:20 AM   Modules accepted: Orders

## 2012-11-25 ENCOUNTER — Ambulatory Visit: Payer: BC Managed Care – PPO | Admitting: Internal Medicine

## 2012-12-24 ENCOUNTER — Other Ambulatory Visit: Payer: Self-pay | Admitting: *Deleted

## 2012-12-24 MED ORDER — AMPHETAMINE-DEXTROAMPHETAMINE 30 MG PO TABS: 30.0000 mg | ORAL_TABLET | Freq: Two times a day (BID) | ORAL | Status: DC | PRN

## 2012-12-24 NOTE — Telephone Encounter (Signed)
Pt aware Rx ready for pickup and to sign agreement. 

## 2013-01-24 ENCOUNTER — Telehealth: Payer: Self-pay | Admitting: *Deleted

## 2013-01-24 MED ORDER — AMPHETAMINE-DEXTROAMPHETAMINE 30 MG PO TABS: 30.0000 mg | ORAL_TABLET | Freq: Two times a day (BID) | ORAL | Status: DC | PRN

## 2013-01-24 NOTE — Telephone Encounter (Signed)
Patient left message on triage requesting refill on Adderall. Last OV 11/19/12 with last refill on 12/24/12 # 60. Okay to refill?

## 2013-01-24 NOTE — Telephone Encounter (Signed)
done

## 2013-01-28 ENCOUNTER — Encounter: Payer: Self-pay | Admitting: Internal Medicine

## 2013-02-26 ENCOUNTER — Telehealth: Payer: Self-pay | Admitting: *Deleted

## 2013-02-26 MED ORDER — AMPHETAMINE-DEXTROAMPHETAMINE 30 MG PO TABS: 30.0000 mg | ORAL_TABLET | Freq: Two times a day (BID) | ORAL | Status: DC | PRN

## 2013-02-26 NOTE — Telephone Encounter (Signed)
Last OV 11-19-12 , last filled 01-24-13 #60

## 2013-02-26 NOTE — Telephone Encounter (Signed)
done

## 2013-02-26 NOTE — Telephone Encounter (Signed)
Pt made aware rx is ready to be picked up at front desk.  

## 2013-04-07 ENCOUNTER — Telehealth: Payer: Self-pay | Admitting: *Deleted

## 2013-04-07 NOTE — Telephone Encounter (Signed)
Yes , ok to RF, tell pt is due for a OV

## 2013-04-07 NOTE — Telephone Encounter (Signed)
Patient left message on triage requesting refill on Aderrall. Patients last office visit was 11/19/12 with last refill on 02/26/13 #60. Okay to refill?

## 2013-04-08 MED ORDER — AMPHETAMINE-DEXTROAMPHETAMINE 30 MG PO TABS: 30.0000 mg | ORAL_TABLET | Freq: Two times a day (BID) | ORAL | Status: DC | PRN

## 2013-04-08 NOTE — Telephone Encounter (Signed)
Pt made aware rx is ready to be picked up at front desk & due for an OV.  

## 2013-04-29 ENCOUNTER — Ambulatory Visit (INDEPENDENT_AMBULATORY_CARE_PROVIDER_SITE_OTHER): Payer: BC Managed Care – PPO | Admitting: Internal Medicine

## 2013-04-29 ENCOUNTER — Encounter: Payer: Self-pay | Admitting: Internal Medicine

## 2013-04-29 VITALS — BP 132/84 | HR 87 | Temp 98.6°F | Wt 205.0 lb

## 2013-04-29 DIAGNOSIS — F988 Other specified behavioral and emotional disorders with onset usually occurring in childhood and adolescence: Secondary | ICD-10-CM

## 2013-04-29 DIAGNOSIS — R5383 Other fatigue: Secondary | ICD-10-CM | POA: Insufficient documentation

## 2013-04-29 LAB — CBC WITH DIFFERENTIAL/PLATELET
Basophils Absolute: 0 10*3/uL (ref 0.0–0.1)
HCT: 44.7 % (ref 39.0–52.0)
Lymphs Abs: 1.8 10*3/uL (ref 0.7–4.0)
Monocytes Absolute: 0.8 10*3/uL (ref 0.1–1.0)
Monocytes Relative: 9.1 % (ref 3.0–12.0)
Platelets: 242 10*3/uL (ref 150.0–400.0)
RDW: 12.8 % (ref 11.5–14.6)

## 2013-04-29 LAB — COMPREHENSIVE METABOLIC PANEL
ALT: 57 U/L — ABNORMAL HIGH (ref 0–53)
Albumin: 4.2 g/dL (ref 3.5–5.2)
CO2: 28 mEq/L (ref 19–32)
Chloride: 105 mEq/L (ref 96–112)
GFR: 89.2 mL/min (ref >60.00)
Potassium: 4 mEq/L (ref 3.5–5.1)
Sodium: 138 mEq/L (ref 135–145)
Total Bilirubin: 0.8 mg/dL (ref 0.3–1.2)
Total Protein: 7.3 g/dL (ref 6.0–8.3)

## 2013-04-29 LAB — TSH: TSH: 2.17 u[IU]/mL (ref 0.35–5.50)

## 2013-04-29 MED ORDER — AMPHETAMINE-DEXTROAMPHETAMINE 30 MG PO TABS: 30.0000 mg | ORAL_TABLET | Freq: Two times a day (BID) | ORAL | Status: DC | PRN

## 2013-04-29 NOTE — Progress Notes (Signed)
  Subjective:    Patient ID: Travis Dunn, male    DOB: Jun 09, 1981, 32 y.o.   MRN: [id number]  HPI Routine office visit Doing well, good compliance with ADD meds, symptoms well-controlled. Patient is concerned about the long-term effect of adderall and if he needs to take it "for the rest of his life".  Past Medical History  Diagnosis Date  . ADD (attention deficit disorder)    Past Surgical History  Procedure Laterality Date  . Appendectomy      has abscess   History   Social History  . Marital Status: Married    Spouse Name: N/A    Number of Children: 1  . Years of Education: N/A   Occupational History  . Radio producer company    Social History Main Topics  . Smoking status: Never Smoker   . Smokeless tobacco: Never Used  . Alcohol Use: Yes     Comment: social  . Drug Use: No  . Sexually Active: Not on file   Other Topics Concern  . Not on file   Social History Narrative   Separated from his wife as off 6- [phone], has a 68 year old son    Review of Systems Sleeps well. No anxiety- depression. Reports that during the weekends when he skips Adderall, he feels fatigued and tired. On further questioning, he snores quite loudly at night.    Objective:   Physical Exam BP 132/84  Pulse 87  Temp(Src) 98.6 F (37 C) (Oral)  Wt 205 lb (92.987 kg)  BMI 27.77 kg/m2  SpO2 99%  General -- alert, well-developed, NAD Neck --no thyromegaly Lungs -- normal respiratory effort, no intercostal retractions, no accessory muscle use, and normal breath sounds.   Heart-- normal rate, regular rhythm, no murmur, and no gallop.   Extremities-- no pretibial edema bilaterally  Psych-- Cognition and judgment appear intact. Alert and cooperative with normal attention span and concentration.  not anxious appearing and not depressed appearing.       Assessment & Plan:

## 2013-04-29 NOTE — Assessment & Plan Note (Signed)
See ADD

## 2013-04-29 NOTE — Assessment & Plan Note (Signed)
Patient was diagnosed with ADD at age 32 after he saw a psychologist, he thinks he also saw a psychiatrist. Has been taking medications since then. Concerned about the long-term side effects of medication, see HPI. I discussed with him potential side effects such as insomnia, weight loss, lack of appetite, hypertension ; at this point he seems to be doing well. He feels a sleepy when skips the med, that could represent OSA masked by adderall (he snores). Plan is to do labs to rule out underlying causes or fatigue, we talked about referral to a psychiatrist but decided to hold off on the referral and reassess on RTC

## 2013-05-05 ENCOUNTER — Encounter: Payer: Self-pay | Admitting: *Deleted

## 2013-06-09 ENCOUNTER — Telehealth: Payer: Self-pay

## 2013-06-09 MED ORDER — AMPHETAMINE-DEXTROAMPHETAMINE 30 MG PO TABS: 30.0000 mg | ORAL_TABLET | Freq: Two times a day (BID) | ORAL | Status: DC | PRN

## 2013-06-09 NOTE — Telephone Encounter (Signed)
Patient aware rx is ready for pickup. 

## 2013-06-09 NOTE — Telephone Encounter (Signed)
Done , ok RF monthly x next 3-4 months

## 2013-06-09 NOTE — Telephone Encounter (Signed)
Message retrieved from triage voicemail (left Friday 06/06/13 @ 3:15pm)  Refill request for Adderall   Last Office Visit: 04/29/2013, Last UDS 12/25/2012 (low risk, next screening 06/27/2013)

## 2013-07-07 ENCOUNTER — Other Ambulatory Visit: Payer: Self-pay | Admitting: General Practice

## 2013-07-07 NOTE — Telephone Encounter (Signed)
amphetamine-dextroamphetamine (ADDERALL) 30 MG tablet Last OV 04-29-13 Med last filled 8-18 #60 with 0 refills   Low Risk Screening due 06/27/13

## 2013-07-08 ENCOUNTER — Telehealth: Payer: Self-pay | Admitting: *Deleted

## 2013-07-08 NOTE — Telephone Encounter (Signed)
Patient requesting refill on adderall Last OV 04/29/13 Last filled 06/09/13 #60 Contract on file Low Risk UDS Okay to refill?

## 2013-07-08 NOTE — Telephone Encounter (Signed)
Refill x1 

## 2013-07-09 ENCOUNTER — Other Ambulatory Visit: Payer: Self-pay | Admitting: *Deleted

## 2013-07-09 DIAGNOSIS — F988 Other specified behavioral and emotional disorders with onset usually occurring in childhood and adolescence: Secondary | ICD-10-CM

## 2013-07-09 MED ORDER — AMPHETAMINE-DEXTROAMPHETAMINE 30 MG PO TABS: 30.0000 mg | ORAL_TABLET | Freq: Two times a day (BID) | ORAL | Status: DC | PRN

## 2013-07-09 NOTE — Telephone Encounter (Signed)
Rx for adderall printed and placed on ledge for signature.  

## 2013-07-09 NOTE — Telephone Encounter (Signed)
Patient notified that script for adderall was ready for pick up.

## 2013-08-08 ENCOUNTER — Telehealth: Payer: Self-pay | Admitting: *Deleted

## 2013-08-08 DIAGNOSIS — F988 Other specified behavioral and emotional disorders with onset usually occurring in childhood and adolescence: Secondary | ICD-10-CM

## 2013-08-08 MED ORDER — AMPHETAMINE-DEXTROAMPHETAMINE 30 MG PO TABS: 30.0000 mg | ORAL_TABLET | Freq: Two times a day (BID) | ORAL | Status: DC | PRN

## 2013-08-08 NOTE — Telephone Encounter (Signed)
Patient is requesting refill on Adderall.  Last OV 04/29/13 Last filled 07/09/13 #60 Contract on file Low risk UDS Okay to refill?

## 2013-08-08 NOTE — Telephone Encounter (Signed)
Ok, done 

## 2013-08-28 ENCOUNTER — Other Ambulatory Visit: Payer: Self-pay

## 2013-09-03 ENCOUNTER — Telehealth: Payer: Self-pay | Admitting: *Deleted

## 2013-09-03 DIAGNOSIS — F988 Other specified behavioral and emotional disorders with onset usually occurring in childhood and adolescence: Secondary | ICD-10-CM

## 2013-09-03 MED ORDER — AMPHETAMINE-DEXTROAMPHETAMINE 30 MG PO TABS: 30.0000 mg | ORAL_TABLET | Freq: Two times a day (BID) | ORAL | Status: DC | PRN

## 2013-09-03 NOTE — Telephone Encounter (Signed)
Done

## 2013-09-03 NOTE — Telephone Encounter (Signed)
Patient is requesting refill on Adderall.  Last visit-04/29/2013  Last filled-08/08/2013  UDS-12/25/2012-low risk, contract signed  Please advise. SW

## 2013-10-06 ENCOUNTER — Telehealth: Payer: Self-pay | Admitting: *Deleted

## 2013-10-06 DIAGNOSIS — F988 Other specified behavioral and emotional disorders with onset usually occurring in childhood and adolescence: Secondary | ICD-10-CM

## 2013-10-06 MED ORDER — AMPHETAMINE-DEXTROAMPHETAMINE 30 MG PO TABS: 30.0000 mg | ORAL_TABLET | Freq: Two times a day (BID) | ORAL | Status: DC | PRN

## 2013-10-06 NOTE — Telephone Encounter (Signed)
Let patient know he is due for a office visit. Prescription printed

## 2013-10-06 NOTE — Telephone Encounter (Signed)
Patient is requesting refill on Adderall.  Last seen-04/29/2013  Last filled-09/03/2013  UDS-12/25/2012 low risk, contract signed   Please advise. SW

## 2013-10-07 NOTE — Telephone Encounter (Signed)
Done. DJR  

## 2013-10-14 ENCOUNTER — Ambulatory Visit: Payer: BC Managed Care – PPO | Admitting: Internal Medicine

## 2013-10-24 ENCOUNTER — Ambulatory Visit (INDEPENDENT_AMBULATORY_CARE_PROVIDER_SITE_OTHER): Payer: Managed Care, Other (non HMO) | Admitting: Physician Assistant

## 2013-10-24 ENCOUNTER — Encounter: Payer: Self-pay | Admitting: Physician Assistant

## 2013-10-24 VITALS — BP 130/84 | HR 99 | Temp 98.2°F | Resp 16 | Ht 72.0 in | Wt 205.0 lb

## 2013-10-24 DIAGNOSIS — J329 Chronic sinusitis, unspecified: Secondary | ICD-10-CM

## 2013-10-24 MED ORDER — AMOXICILLIN-POT CLAVULANATE 875-125 MG PO TABS: 1.0000 | ORAL_TABLET | Freq: Two times a day (BID) | ORAL | Status: DC

## 2013-10-24 NOTE — Progress Notes (Signed)
Pre visit review using our clinic review tool, if applicable. No additional management support is needed unless otherwise documented below in the visit note/SLS  

## 2013-10-24 NOTE — Patient Instructions (Signed)
Take antibiotic as prescribed.  Continue eye drops.  Increase fluid intake.  Rest.  Saline nasal spray.  Probiotic.  Mucinex.  Delsym for cough.  Avoid rubbing eyes and maintain good hygiene to prevent spread of pink eye.

## 2013-10-25 DIAGNOSIS — J329 Chronic sinusitis, unspecified: Secondary | ICD-10-CM | POA: Insufficient documentation

## 2013-10-25 NOTE — Progress Notes (Signed)
Patient ID: Travis Dunn, male   DOB: 11/01/1980, 33 y.o.   MRN: [id number]  Patient presents complaining of one week of head and chest congestion, postnasal drainage, sore throat, cough, sinus pressure and sinus pain. Patient denies history of allergy or asthma. Denies shortness of breath and wheezing. Patient denies fever, chills, weight loss. Denies recent travel or sick contact.  Past Medical History  Diagnosis Date  . ADD (attention deficit disorder)     Current Outpatient Prescriptions on File Prior to Visit  Medication Sig Dispense Refill  . amphetamine-dextroamphetamine (ADDERALL) 30 MG tablet Take 1 tablet (30 mg total) by mouth 2 (two) times daily as needed.  60 tablet  0   No current facility-administered medications on file prior to visit.    No Known Allergies  Family History  Problem Relation Age of Onset  . Heart attack      GF 4550s y/o  . Diabetes Father   . Colon cancer Neg Hx   . Prostate cancer Neg Hx     History   Social History  . Marital Status: Married    Spouse Name: N/A    Number of Children: 1  . Years of Education: N/A   Occupational History  . 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 Smoking status: Never Smoker   . Smokeless tobacco: Never Used  . Alcohol Use: Yes     Comment: social  . Drug Use: No  . Sexual Activity: None   Other Topics Concern  . None   Social History Narrative   Separated from his wife as off [phone]- [phone], has a 34 year old son    Review of Systems - See HPI.  All other ROS are negative.  Filed Vitals:   10/24/13 1133  BP: 130/84  Pulse: 99  Temp: 98.2 F (36.8 C)  Resp: 16   Physical Exam  Vitals reviewed. Constitutional: He is oriented to person, place, and time and well-developed, well-nourished, and in no distress.  HENT:  Head: Normocephalic and atraumatic.  Right Ear: External ear normal.  Left Ear: External ear normal.  Nose: Nose normal.  Mouth/Throat: Oropharynx  is clear and moist. No oropharyngeal exudate.  Tympanic membranes within normal limits bilaterally. Tenderness to percussion of sinuses noted.  Eyes: Conjunctivae are normal. Pupils are equal, round, and reactive to light.  Neck: Neck supple.  Cardiovascular: Normal rate, regular rhythm, normal heart sounds and intact distal pulses.   Pulmonary/Chest: Effort normal and breath sounds normal. No respiratory distress. He has no wheezes. He has no rales. He exhibits no tenderness.  Lymphadenopathy:    He has no cervical adenopathy.  Neurological: He is alert and oriented to person, place, and time.  Skin: Skin is warm and dry. No rash noted.  Psychiatric: Affect normal.     Assessment/Plan: No problem-specific assessment & plan notes found for this encounter.

## 2013-10-25 NOTE — Assessment & Plan Note (Signed)
Rx Augmentin. Increase fluid intake. Rest. Saline nasal spray. Probiotic. Mucinex. Humidifier in bedroom.

## 2013-11-07 ENCOUNTER — Encounter: Payer: Self-pay | Admitting: Internal Medicine

## 2013-11-07 ENCOUNTER — Ambulatory Visit (INDEPENDENT_AMBULATORY_CARE_PROVIDER_SITE_OTHER): Payer: Managed Care, Other (non HMO) | Admitting: Internal Medicine

## 2013-11-07 VITALS — BP 124/80 | HR 90 | Temp 98.0°F | Wt 207.0 lb

## 2013-11-07 DIAGNOSIS — Z23 Encounter for immunization: Secondary | ICD-10-CM

## 2013-11-07 DIAGNOSIS — F988 Other specified behavioral and emotional disorders with onset usually occurring in childhood and adolescence: Secondary | ICD-10-CM

## 2013-11-07 DIAGNOSIS — Z0000 Encounter for general adult medical examination without abnormal findings: Secondary | ICD-10-CM | POA: Insufficient documentation

## 2013-11-07 MED ORDER — AMPHETAMINE-DEXTROAMPHETAMINE 30 MG PO TABS
30.0000 mg | ORAL_TABLET | Freq: Two times a day (BID) | ORAL | Status: DC | PRN
Start: 2013-11-07 — End: 2013-11-07

## 2013-11-07 MED ORDER — AMPHETAMINE-DEXTROAMPHETAMINE 30 MG PO TABS: 30.0000 mg | ORAL_TABLET | Freq: Two times a day (BID) | ORAL | Status: DC | PRN

## 2013-11-07 NOTE — Progress Notes (Signed)
   Subjective:    Patient ID: Travis GlazierJoseph P Enyeart, male    DOB: 1981/06/04, 33 y.o.   MRN: [id number]  HPI  ROV ADD--needs a refill of medication, good  compliance, no apparent side effects. We also counseled about primary care : See assessment and plan  Past Medical History  Diagnosis Date  . ADD (attention deficit disorder)    Past Surgical History  Procedure Laterality Date  . Appendectomy      has abscess   History   Social History  . Marital Status: Married    Spouse Name: N/A    Number of Children: 1  . Years of Education: N/A   Occupational History  . 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 Smoking status: Never Smoker   . Smokeless tobacco: Never Used  . Alcohol Use: Yes     Comment: social  . Drug Use: No  . Sexual Activity: Not on file   Other Topics Concern  . Not on file   Social History Narrative   Separated from his wife as off 6- [phone], has a 34 year old son     Review of Systems Previously seen with fatigue and feeling sleepy: Symptoms resolve Denies any anxiety or depression at this time No insomnia    Objective:   Physical Exam BP 124/80  Pulse 90  Temp(Src) 98 F (36.7 C)  Wt 207 lb (93.895 kg)  SpO2 100% General -- alert, well-developed, NAD.   Psych-- Cognition and judgment appear intact. Cooperative with normal attention span and concentration. No anxious or depressed appearing.      Assessment & Plan:

## 2013-11-07 NOTE — Patient Instructions (Signed)
Go to the lab today for a UDS  Next visit in 6 months    Testicular Self-Exam A self-examination of your testicles involves looking at and feeling your testicles for abnormal lumps or swelling. Several things can cause swelling, lumps, or pain in your testicles. Some of these causes are:  Injuries.  Inflammation.  Infection.  Accumulation of fluids around your testicle (hydrocele).  Twisted testicles (testicular torsion).  Testicular cancer. Self-examination of the testicles and groin areas may be advised if you are at risk for testicular cancer. Risks for testicular cancer include:  An undescended testicle (cryptorchidism).  A history of previous testicular cancer.  A family history of testicular cancer. The testicles are easiest to examine after warm baths or showers and are more difficult to examine when you are cold. This is because the muscles attached to the testicles retract and pull them up higher or into the abdomen. Follow these steps while you are standing:  Hold your penis away from your body.  Roll one testicle between your thumb and forefinger, feeling the entire testicle.  Roll the other testicle between your thumb and forefinger, feeling the entire testicle. Feel for lumps, swelling, or discomfort. A normal testicle is egg shaped and feels firm. It is smooth and not tender. The spermatic cord can be felt as a firm spaghetti-like cord at the back of your testicle. It is also important to examine the crease between the front of your leg and your abdomen. Feel for any bumps that are tender. These could be enlarged lymph nodes.  Document Released: 01/15/2001 Document Revised: 06/11/2013 Document Reviewed: 03/31/2013 [facility] Patient Information 2014 [employer], [place].

## 2013-11-07 NOTE — Assessment & Plan Note (Signed)
Although he is not doing a physical today, I provided a tetanus shot, we also discussed self testicular exam

## 2013-11-07 NOTE — Assessment & Plan Note (Signed)
Symptoms well-controlled, UDS -12-2012 was neg, will RF his medications today. He was feeling sleep and fatigue, see previous entry, symptoms resolve

## 2013-11-07 NOTE — Progress Notes (Signed)
Pre visit review using our clinic review tool, if applicable. No additional management support is needed unless otherwise documented below in the visit note. 

## 2014-01-12 ENCOUNTER — Telehealth: Payer: Self-pay | Admitting: Internal Medicine

## 2014-01-12 DIAGNOSIS — F988 Other specified behavioral and emotional disorders with onset usually occurring in childhood and adolescence: Secondary | ICD-10-CM

## 2014-01-12 MED ORDER — AMPHETAMINE-DEXTROAMPHETAMINE 30 MG PO TABS: 30.0000 mg | ORAL_TABLET | Freq: Two times a day (BID) | ORAL | Status: DC | PRN

## 2014-01-12 NOTE — Telephone Encounter (Signed)
Patient called and stated that he left a message on the triage line last week to refill his Adderall and has not heard a response from us . Please advise

## 2014-01-12 NOTE — Telephone Encounter (Signed)
done

## 2014-01-12 NOTE — Telephone Encounter (Signed)
Adderall 30mg  bid #60 11/07/2013 UDS:12/25/2012 Low Risk Contract 01/2013

## 2014-01-13 NOTE — Telephone Encounter (Signed)
Spoke with patient and made aware that Rx for Adderall was ready to be picked up

## 2014-02-10 ENCOUNTER — Telehealth: Payer: Self-pay | Admitting: Internal Medicine

## 2014-02-10 DIAGNOSIS — F988 Other specified behavioral and emotional disorders with onset usually occurring in childhood and adolescence: Secondary | ICD-10-CM

## 2014-02-10 MED ORDER — AMPHETAMINE-DEXTROAMPHETAMINE 30 MG PO TABS: 30.0000 mg | ORAL_TABLET | Freq: Two times a day (BID) | ORAL | Status: DC | PRN

## 2014-02-10 NOTE — Telephone Encounter (Signed)
adderall 30 mg Last OV- 11/07/13 Last refilled- 01/12/14 #60 / 0 rf UDS- 12/25/12 LOW risk

## 2014-02-10 NOTE — Telephone Encounter (Signed)
Patient called and requested a refill for adderall. Please advise.  °

## 2014-02-10 NOTE — Telephone Encounter (Signed)
Done

## 2014-02-11 NOTE — Telephone Encounter (Signed)
lmovm - rx rdy for pick up

## 2014-03-11 ENCOUNTER — Telehealth: Payer: Self-pay | Admitting: Internal Medicine

## 2014-03-11 DIAGNOSIS — F988 Other specified behavioral and emotional disorders with onset usually occurring in childhood and adolescence: Secondary | ICD-10-CM

## 2014-03-11 NOTE — Telephone Encounter (Signed)
Caller name: Jomarie LongsJoseph  Call back [phone]   Reason for call:  Pt is needing a refill on RX amphetamine-dextroamphetamine (ADDERALL) 30 MG tablet

## 2014-03-12 MED ORDER — AMPHETAMINE-DEXTROAMPHETAMINE 30 MG PO TABS: 30.0000 mg | ORAL_TABLET | Freq: Two times a day (BID) | ORAL | Status: DC | PRN

## 2014-03-12 NOTE — Telephone Encounter (Signed)
amphetamine-dextroamphetamine (ADDERALL) 30 MG tablet Last refill: 4.21.2015 #60 Last OV: 1.16.2015   Last UDS: 12/25/2013, low risk. UDS due

## 2014-03-12 NOTE — Telephone Encounter (Signed)
Lefr message on voice mail that Rx for Adderall was ready to be picked up.

## 2014-03-12 NOTE — Telephone Encounter (Signed)
Ok RF x 2 Rx  , advise pt  He is due for a OV ~ July 2015, will get a UDS then

## 2014-05-15 ENCOUNTER — Other Ambulatory Visit: Payer: Self-pay | Admitting: General Practice

## 2014-05-15 ENCOUNTER — Telehealth: Payer: Self-pay | Admitting: Internal Medicine

## 2014-05-15 DIAGNOSIS — F988 Other specified behavioral and emotional disorders with onset usually occurring in childhood and adolescence: Secondary | ICD-10-CM

## 2014-05-15 MED ORDER — AMPHETAMINE-DEXTROAMPHETAMINE 30 MG PO TABS: 30.0000 mg | ORAL_TABLET | Freq: Two times a day (BID) | ORAL | Status: DC | PRN

## 2014-05-15 NOTE — Telephone Encounter (Signed)
Patient called to request a refill for adderall.

## 2014-05-15 NOTE — Telephone Encounter (Signed)
rx refill- adderall 30 mg  Last OV- 11/07/13  Last refilled- 03/12/14 # 60 / 0 rf  UDS- 12/25/12 LOW risk

## 2014-05-15 NOTE — Telephone Encounter (Signed)
lmovm rdy for pick up

## 2014-05-15 NOTE — Telephone Encounter (Signed)
Ok for #60, no refills 

## 2014-06-15 ENCOUNTER — Telehealth: Payer: Self-pay | Admitting: Internal Medicine

## 2014-06-15 DIAGNOSIS — F988 Other specified behavioral and emotional disorders with onset usually occurring in childhood and adolescence: Secondary | ICD-10-CM

## 2014-06-15 MED ORDER — AMPHETAMINE-DEXTROAMPHETAMINE 30 MG PO TABS: 30.0000 mg | ORAL_TABLET | Freq: Two times a day (BID) | ORAL | Status: DC | PRN

## 2014-06-15 NOTE — Telephone Encounter (Signed)
Pt requesting refill for Adderall XR 30 mg.   Last OV: 11/07/2013 Last Fill: 05/15/2014 # 60  Last UDS: 12/25/2012 Low Risk   Please Advise.

## 2014-06-15 NOTE — Telephone Encounter (Signed)
Caller name: Jospeph Relation to pt: Call back [phone]   Reason for call: pt needs Rx amphetamine-dextroamphetamine (ADDERALL) 30 MG tablet refilled.  Call when complete.

## 2014-06-15 NOTE — Telephone Encounter (Signed)
LMOM, medication is ready for pick up at front desk.

## 2014-06-15 NOTE — Telephone Encounter (Signed)
Call pt: RF printed x 2,  Needs OV before next RF , please schedule it

## 2014-08-17 ENCOUNTER — Telehealth: Payer: Self-pay | Admitting: Internal Medicine

## 2014-08-17 NOTE — Telephone Encounter (Signed)
Pt informed last month that he needed an OV visit before next refill. He has not been seen since 10/2013, Dr. Drue NovelPaz will not refill until appt is made.

## 2014-08-17 NOTE — Telephone Encounter (Signed)
Caller name: Milus GlazierSmith, Newt P Relation to pt: self  Call back number: [phone]   Reason for call: PT REQUESTING A REFILL amphetamine-dextroamphetamine (ADDERALL) 30 MG tablet

## 2014-08-19 NOTE — Telephone Encounter (Signed)
Caller name: Milus GlazierSmith, Alucard P Relation to pt: self  Call back number: [phone]   Reason for call:   pt scheduled med check appointment 10/30/115 at 10:45 requesting pills in between time will run out today. Please advise pt stated he would like to speak with the nurse.

## 2014-08-21 ENCOUNTER — Ambulatory Visit (INDEPENDENT_AMBULATORY_CARE_PROVIDER_SITE_OTHER): Payer: PRIVATE HEALTH INSURANCE | Admitting: Internal Medicine

## 2014-08-21 ENCOUNTER — Encounter: Payer: Self-pay | Admitting: Internal Medicine

## 2014-08-21 VITALS — BP 153/83 | HR 76 | Temp 98.1°F | Wt 212.5 lb

## 2014-08-21 DIAGNOSIS — F988 Other specified behavioral and emotional disorders with onset usually occurring in childhood and adolescence: Secondary | ICD-10-CM

## 2014-08-21 DIAGNOSIS — Z23 Encounter for immunization: Secondary | ICD-10-CM

## 2014-08-21 DIAGNOSIS — F909 Attention-deficit hyperactivity disorder, unspecified type: Secondary | ICD-10-CM

## 2014-08-21 MED ORDER — AMPHETAMINE-DEXTROAMPHETAMINE 30 MG PO TABS: 30.0000 mg | ORAL_TABLET | Freq: Two times a day (BID) | ORAL | Status: DC | PRN

## 2014-08-21 NOTE — Assessment & Plan Note (Signed)
Continue with Adderall twice a day, 2 prescriptions provided. Low risk UDS 12-2012, repeating one today  Follow-up in 6-8 months

## 2014-08-21 NOTE — Progress Notes (Signed)
Pre visit review using our clinic review tool, if applicable. No additional management support is needed unless otherwise documented below in the visit note. 

## 2014-08-21 NOTE — Patient Instructions (Signed)
Please go to the lab and get a UDS  Next office visit 6 to 8 months Call for refills   Check the  blood pressure  monthly   Be sure your blood pressure is between  145/85  and 110/65.  if it is consistently higher or lower, let me know

## 2014-08-21 NOTE — Progress Notes (Signed)
   Subjective:    Patient ID: Travis Dunn, male    DOB: 1981/05/07, 33 y.o.   MRN: [id number]  DOS:  08/21/2014 Type of visit - description : rov Interval history: In general feeling well, good compliance with medication. BP today slightly elevated, does not take ambulatory BPs.  ROS  Denies chest pain and difficulty breathing, no palpitations No weight loss. No insomnia. No anxiety or depression.  Past Medical History  Diagnosis Date  . ADD (attention deficit disorder)     Past Surgical History  Procedure Laterality Date  . Appendectomy      has abscess    History   Social History  . Marital Status: Married    Spouse Name: N/A    Number of Children: 1  . Years of Education: N/A   Occupational History  . 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 Smoking status: Never Smoker   . Smokeless tobacco: Never Used  . Alcohol Use: Yes     Comment: social  . Drug Use: No  . Sexual Activity: Not on file   Other Topics Concern  . Not on file   Social History Narrative   Separated from his wife as off [phone]- [id number], has a 34 year old son        Medication List       This list is accurate as of: 08/21/14  7:38 PM.  Always use your most recent med list.               amphetamine-dextroamphetamine 30 MG tablet  Commonly known as:  ADDERALL  Take 1 tablet by mouth 2 (two) times daily as needed.           Objective:   Physical Exam BP 153/83  Pulse 76  Temp(Src) 98.1 F (36.7 C) (Oral)  Wt 212 lb 8 oz (96.389 kg)  SpO2 100%  General -- alert, well-developed, NAD.   Lungs -- normal respiratory effort, no intercostal retractions, no accessory muscle use, and normal breath sounds.  Heart-- normal rate, regular rhythm, no murmur.   Extremities-- no pretibial edema bilaterally  Neurologic--  alert & oriented X3. Speech normal, gait appropriate for age, strength symmetric and appropriate for age.  Psych-- Cognition and judgment  appear intact. Cooperative with normal attention span and concentration. No anxious or depressed appearing.       Assessment & Plan:    BP is slightly elevated, recommend monitor BP monitor, see instructions.  Flu shot provided today

## 2014-10-26 ENCOUNTER — Telehealth: Payer: Self-pay | Admitting: Internal Medicine

## 2014-10-26 DIAGNOSIS — F988 Other specified behavioral and emotional disorders with onset usually occurring in childhood and adolescence: Secondary | ICD-10-CM

## 2014-10-26 MED ORDER — AMPHETAMINE-DEXTROAMPHETAMINE 30 MG PO TABS: 30.0000 mg | ORAL_TABLET | Freq: Two times a day (BID) | ORAL | Status: DC | PRN

## 2014-10-26 NOTE — Telephone Encounter (Signed)
Caller name: Roark Relation to pt: self Call back number: [phone] Pharmacy:  Reason for call:   Needs adderall rx

## 2014-10-26 NOTE — Telephone Encounter (Signed)
Done x 2 Rx

## 2014-10-26 NOTE — Telephone Encounter (Signed)
Pt is requesting refill on Adderall.  Last OV: 08/21/2014 Last Fill: 08/21/2014 # 60 0RF UDS: 12/25/2012 Low risk  Please advise.

## 2014-10-27 NOTE — Telephone Encounter (Signed)
Spoke with Pt, informed him rx is ready for pick up at front desk.  

## 2014-12-23 ENCOUNTER — Telehealth: Payer: Self-pay | Admitting: Internal Medicine

## 2014-12-23 DIAGNOSIS — F988 Other specified behavioral and emotional disorders with onset usually occurring in childhood and adolescence: Secondary | ICD-10-CM

## 2014-12-23 MED ORDER — AMPHETAMINE-DEXTROAMPHETAMINE 30 MG PO TABS: 30.0000 mg | ORAL_TABLET | Freq: Two times a day (BID) | ORAL | Status: DC | PRN

## 2014-12-23 NOTE — Telephone Encounter (Signed)
Rx for 12/2014 and 01/2015 printed. Awaiting signature by Dr. Drue NovelPaz.

## 2014-12-23 NOTE — Telephone Encounter (Signed)
Caller name: Milus GlazierSmith, Jodie P Relation to pt: self  Call back number: [phone]   Reason for call:  Pt requesting a refill amphetamine-dextroamphetamine (ADDERALL) 30 MG tablet

## 2014-12-23 NOTE — Telephone Encounter (Signed)
Print two Rx please

## 2014-12-23 NOTE — Telephone Encounter (Signed)
Pt is requesting refill on Adderall.  Last OV: 08/21/2014  Last Fill: 10/26/2014 # 60 0RF UDS: 12/25/2012 Low risk. Due for UDS  Please advise.

## 2014-12-23 NOTE — Telephone Encounter (Signed)
Spoke with Pt, informed him that Rx is ready for pick up at front desk. Pt verbalized understanding.  

## 2014-12-28 ENCOUNTER — Other Ambulatory Visit: Payer: Self-pay

## 2014-12-28 DIAGNOSIS — F988 Other specified behavioral and emotional disorders with onset usually occurring in childhood and adolescence: Secondary | ICD-10-CM

## 2014-12-28 MED ORDER — AMPHETAMINE-DEXTROAMPHETAMINE 30 MG PO TABS: 30.0000 mg | ORAL_TABLET | Freq: Two times a day (BID) | ORAL | Status: DC | PRN

## 2015-02-23 ENCOUNTER — Telehealth: Payer: Self-pay | Admitting: Internal Medicine

## 2015-02-23 DIAGNOSIS — F988 Other specified behavioral and emotional disorders with onset usually occurring in childhood and adolescence: Secondary | ICD-10-CM

## 2015-02-23 MED ORDER — AMPHETAMINE-DEXTROAMPHETAMINE 30 MG PO TABS: 30.0000 mg | ORAL_TABLET | Freq: Two times a day (BID) | ORAL | Status: DC | PRN

## 2015-02-23 NOTE — Telephone Encounter (Signed)
Spoke with Pt, informed him Rx's are ready for pick up at front desk at his convenience. Pt verbalized understanding.

## 2015-02-23 NOTE — Telephone Encounter (Signed)
2 Rx's printed (for May and June 2016), awaiting MD signature.  

## 2015-02-23 NOTE — Telephone Encounter (Signed)
Pt is requesting refill on Adderall.  Last OV: 08/21/2014 Last Fill: 12/28/2014 # 60 0RF UDS: 12/25/2012 Low risk  DUE FOR UDS  Please advise.

## 2015-02-23 NOTE — Telephone Encounter (Signed)
please print 2 prescriptions, Follow-up should be by June, will do a UDS at that time

## 2015-02-23 NOTE — Telephone Encounter (Signed)
Caller name: Jaisen Relation to pt: self Call back number: [phone] Pharmacy:  Reason for call:   Requesting adderall rx.

## 2015-03-12 ENCOUNTER — Ambulatory Visit (INDEPENDENT_AMBULATORY_CARE_PROVIDER_SITE_OTHER): Payer: PRIVATE HEALTH INSURANCE | Admitting: Internal Medicine

## 2015-03-12 ENCOUNTER — Encounter: Payer: Self-pay | Admitting: Internal Medicine

## 2015-03-12 VITALS — BP 124/64 | HR 93 | Temp 98.5°F | Ht 71.0 in | Wt 213.2 lb

## 2015-03-12 DIAGNOSIS — F988 Other specified behavioral and emotional disorders with onset usually occurring in childhood and adolescence: Secondary | ICD-10-CM

## 2015-03-12 DIAGNOSIS — F909 Attention-deficit hyperactivity disorder, unspecified type: Secondary | ICD-10-CM | POA: Diagnosis not present

## 2015-03-12 DIAGNOSIS — R748 Abnormal levels of other serum enzymes: Secondary | ICD-10-CM | POA: Diagnosis not present

## 2015-03-12 LAB — ALT: ALT: 66 U/L — AB (ref 0–53)

## 2015-03-12 LAB — AST: AST: 25 U/L (ref 0–37)

## 2015-03-12 MED ORDER — AMPHETAMINE-DEXTROAMPHETAMINE 30 MG PO TABS: 30.0000 mg | ORAL_TABLET | Freq: Two times a day (BID) | ORAL | Status: DC | PRN

## 2015-03-12 NOTE — Assessment & Plan Note (Signed)
Symptoms well-controlled. 2 prescriptions printed today UDS today Follow-up 8 months Call for refills as needed

## 2015-03-12 NOTE — Patient Instructions (Signed)
Get your blood work before you leave  You  also need a UDS  Next visit in 8 months

## 2015-03-12 NOTE — Progress Notes (Signed)
Pre visit review using our clinic review tool, if applicable. No additional management support is needed unless otherwise documented below in the visit note. 

## 2015-03-12 NOTE — Progress Notes (Signed)
   Subjective:    Patient ID: Travis Dunn, male    DOB: 1981-09-01, 34 y.o.   MRN: [id number]  DOS:  03/12/2015 Type of visit - description : rov Interval history: ADD, good compliance with Adderall without apparent side effects. On chart review, one time his LFTs were elevated.   Review of Systems Denies anxiety or depression No headache or weight loss No insomnia  Past Medical History  Diagnosis Date  . ADD (attention deficit disorder)     Past Surgical History  Procedure Laterality Date  . Appendectomy      has abscess    History   Social History  . Marital Status: Married    Spouse Name: N/A  . Number of Children: 1  . Years of Education: N/A   Occupational History  . 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 Smoking status: Never Smoker   . Smokeless tobacco: Never Used  . Alcohol Use: Yes     Comment: social  . Drug Use: No  . Sexual Activity: Not on file   Other Topics Concern  . Not on file   Social History Narrative   Divorced, has a 35 year old son        Medication List       This list is accurate as of: 03/12/15 11:59 PM.  Always use your most recent med list.               amphetamine-dextroamphetamine 30 MG tablet  Commonly known as:  ADDERALL  Take 1 tablet by mouth 2 (two) times daily as needed.           Objective:   Physical Exam BP 124/64 mmHg  Pulse 93  Temp(Src) 98.5 F (36.9 C) (Oral)  Ht 5\' 11"  (1.803 m)  Wt 213 lb 4 oz (96.73 kg)  BMI 29.76 kg/m2  SpO2 99% General:   Well developed, well nourished . NAD.  HEENT:  Normocephalic . Face symmetric, atraumatic Lungs:  CTA B Normal respiratory effort, no intercostal retractions, no accessory muscle use. Heart: RRR,  no murmur.  No pretibial edema bilaterally  Skin: Not pale. Not jaundice Neurologic:  alert & oriented X3.  Speech normal, gait appropriate for age and unassisted Psych--  Cognition and judgment appear intact.    Cooperative with normal attention span and concentration.  Behavior appropriate. No anxious or depressed appearing.       Assessment & Plan:    Slightly elevated LFTs, recheck labs

## 2015-03-31 ENCOUNTER — Telehealth: Payer: Self-pay

## 2015-03-31 NOTE — Telephone Encounter (Signed)
UDS: 03/12/2015  Positive for Adderall     Low risk per Dr. Drue NovelPaz 03/31/2015

## 2015-05-03 ENCOUNTER — Telehealth: Payer: Self-pay | Admitting: Internal Medicine

## 2015-05-03 NOTE — Telephone Encounter (Signed)
PA approved effective 05/03/2015 through 05/02/2016. Case Number: [id number]. Approval letter sent for scanning. JG//CMA

## 2015-05-03 NOTE — Telephone Encounter (Signed)
PA submitted for expedited review. Awaiting determination. JG//CMA 

## 2015-05-03 NOTE — Telephone Encounter (Signed)
Morrie SheldonAshley from [facility] calling in regarding this. Best # [phone]     Wants to know if patients ADHD or ADD symptoms have been present prior to age 34?

## 2015-05-03 NOTE — Telephone Encounter (Signed)
Caller name: Travis LongsJoseph Relationship to patient:self  Can be reached:[phone] [facility] [address]  Reason for call: [facility] is requiring a prior autho on his adderall 30 mg   He has 2 left.  They said you can call [phone]

## 2015-07-05 ENCOUNTER — Telehealth: Payer: Self-pay | Admitting: Internal Medicine

## 2015-07-05 DIAGNOSIS — F988 Other specified behavioral and emotional disorders with onset usually occurring in childhood and adolescence: Secondary | ICD-10-CM

## 2015-07-05 MED ORDER — AMPHETAMINE-DEXTROAMPHETAMINE 30 MG PO TABS: 30.0000 mg | ORAL_TABLET | Freq: Two times a day (BID) | ORAL | Status: DC | PRN

## 2015-07-05 NOTE — Telephone Encounter (Signed)
Ok  #60, 2 prescriptions 

## 2015-07-05 NOTE — Telephone Encounter (Signed)
Please inform Pt that Rx is ready for pick up at the front desk at his convenience. Thank you.  

## 2015-07-05 NOTE — Telephone Encounter (Signed)
Rx for September and October 2016 printed, awaiting MD signature.

## 2015-07-05 NOTE — Telephone Encounter (Signed)
Patient informed. 

## 2015-07-05 NOTE — Telephone Encounter (Signed)
Relation to pt:self °Call back number:[phone] ° ° °Reason for call:  °Patient requesting a refill amphetamine-dextroamphetamine (ADDERALL) 30 MG tablet  °

## 2015-07-05 NOTE — Telephone Encounter (Signed)
Pt is requesting refill on Adderall.   Last OV: 03/12/2015 Last Fill: 03/12/2015 #60 0RF UDS: 03/12/2015 Low risk  Please advise.

## 2015-07-30 ENCOUNTER — Ambulatory Visit (INDEPENDENT_AMBULATORY_CARE_PROVIDER_SITE_OTHER): Payer: PRIVATE HEALTH INSURANCE | Admitting: Medical

## 2015-07-30 VITALS — BP 122/80 | HR 88 | Temp 98.7°F | Ht 71.0 in | Wt 210.0 lb

## 2015-07-30 DIAGNOSIS — H6502 Acute serous otitis media, left ear: Secondary | ICD-10-CM

## 2015-07-30 DIAGNOSIS — H60392 Other infective otitis externa, left ear: Secondary | ICD-10-CM | POA: Diagnosis not present

## 2015-07-30 MED ORDER — CEFTRIAXONE SODIUM 1 G IJ SOLR
1.0000 g | Freq: Once | INTRAMUSCULAR | Status: AC
  Administered 2015-07-30: 1 g via INTRAMUSCULAR

## 2015-07-30 MED ORDER — NEOMYCIN-POLYMYXIN-HC 1 % OT SOLN: 3.0000 [drp] | Freq: Three times a day (TID) | OTIC | Status: DC

## 2015-07-30 MED ORDER — AMOXICILLIN-POT CLAVULANATE 875-125 MG PO TABS: 1.0000 | ORAL_TABLET | Freq: Two times a day (BID) | ORAL | Status: DC

## 2015-07-30 NOTE — Progress Notes (Signed)
Pre visit review using our clinic review tool, if applicable. No additional management support is needed unless otherwise documented below in the visit note. 

## 2015-07-30 NOTE — Patient Instructions (Addendum)
Rocephin 1 gram im. More aggressive tx today based on appearance of tm, level of pain and due to approaching weekend.  Rx augmentin antibiotic.  Rx cortisprorin otic.  Follow up in 7 days or as needed. Your pain level should be significantly improved by Monday. If not let us know.  If  pain worsens or  behind the ear then ED evaluation. Also could use ibuprofen over the weekend.

## 2015-07-30 NOTE — Progress Notes (Signed)
Subjective:    Patient ID: Travis Dunn, male    DOB: [birth year], 34 y.o.   MRN: [id number]  HPI   Pt in with lt ear pain. Pain for 4-5 days. Pain stopped for about a day but came back. Pain moderate to severe. Hard to sleep due to discomfort. No fever, no chills or sweats. Pt denies preceding congestion.  Pt denies hx of any ear infection that he can remember.     Review of Systems  Constitutional: Negative for fever, chills, diaphoresis, activity change and fatigue.  HENT: Positive for ear pain. Negative for congestion, sinus pressure, sneezing, tinnitus and voice change.   Respiratory: Negative for cough, chest tightness and shortness of breath.   Cardiovascular: Negative for chest pain, palpitations and leg swelling.  Gastrointestinal: Negative for nausea, vomiting and abdominal pain.  Musculoskeletal: Negative for neck pain and neck stiffness.  Neurological: Negative for dizziness, weakness, numbness and headaches.  Psychiatric/Behavioral: Negative for behavioral problems, confusion and agitation. The patient is not nervous/anxious.     Past Medical History  Diagnosis Date  . ADD (attention deficit disorder)     Social History   Social History  . Marital Status: Married    Spouse Name: N/A  . Number of Children: 1  . Years of Education: N/A   Occupational History  . Radio producer company    Social History Main Topics  . Smoking status: Never Smoker   . Smokeless tobacco: Never Used  . Alcohol Use: Yes     Comment: social  . Drug Use: No  . Sexual Activity: Not on file   Other Topics Concern  . Not on file   Social History Narrative   Divorced, has a 66 year old son    Past Surgical History  Procedure Laterality Date  . Appendectomy      has abscess    Family History  Problem Relation Age of Onset  . Heart attack      GF 52s y/o  . Diabetes Father   . Colon cancer Neg Hx   . Prostate cancer Neg Hx     No Known  Allergies  Current Outpatient Prescriptions on File Prior to Visit  Medication Sig Dispense Refill  . amphetamine-dextroamphetamine (ADDERALL) 30 MG tablet Take 1 tablet by mouth 2 (two) times daily as needed. 60 tablet 0  . amphetamine-dextroamphetamine (ADDERALL) 30 MG tablet Take 1 tablet by mouth 2 (two) times daily as needed. 60 tablet 0   No current facility-administered medications on file prior to visit.    BP 122/80 mmHg  Pulse 88  Temp(Src) 98.7 F (37.1 C) (Oral)  Ht  (1.803 m)  Wt 210 lb (95.255 kg)  BMI 29.30 kg/m2  SpO2 99%       Objective:   Physical Exam  General  Mental Status - Alert. General Appearance - Well groomed. Not in acute distress.  Skin Rashes- No Rashes.  HEENT Head- Normal. Ear Auditory Canal - Left- mild swelling with tragal tendernes. Right - Normal.Tympanic Membrane- Left- Very bright red tim with bulge(tender to palpation beneath ear but no  Mastoid tenderness). Right- Normal. Eye Sclera/Conjunctiva- Left- Normal. Right- Normal. Nose & Sinuses Nasal Mucosa- Left-  Not boggy or Congested. Right-  Not  boggy or Congested. Mouth & Throat Lips: Upper Lip- Normal: no dryness, cracking, pallor, cyanosis, or vesicular eruption. Lower Lip-Normal: no dryness, cracking, pallor, cyanosis or vesicular eruption. Buccal Mucosa- Bilateral- No Aphthous ulcers. Oropharynx- No Discharge or  Erythema. Tonsils: Characteristics- Bilateral- No Erythema or Congestion. Size/Enlargement- Bilateral- No enlargement. Discharge- bilateral-None.  Neck Neck- Supple. No Masses.   Chest and Lung Exam Auscultation: Breath Sounds:- even and unlabored.  Cardiovascular Auscultation:Rythm- Regular, rate and rhythm. Murmurs & Other Heart Sounds:Ausculatation of the heart reveal- No Murmurs.  Lymphatic Head & Neck General Head & Neck Lymphatics: Bilateral: Description- No Localized lymphadenopathy.       Assessment & Plan:  Rocephin 1 gram im. More  aggressive tx today based on appearance of tm, level of pain and due to approaching weekend.  Rx augmentin antibiotic.  Rx cortisprorin otic.  Follow up in 7 days or as needed. Your pain level should be significantly improved by Monday. If not let us know.  If  pain worsens or behind the ear then ED evaluation. Also could use ibuprofen over the weekend.

## 2015-09-06 ENCOUNTER — Telehealth: Payer: Self-pay | Admitting: Internal Medicine

## 2015-09-06 DIAGNOSIS — F988 Other specified behavioral and emotional disorders with onset usually occurring in childhood and adolescence: Secondary | ICD-10-CM

## 2015-09-06 MED ORDER — AMPHETAMINE-DEXTROAMPHETAMINE 30 MG PO TABS: 30.0000 mg | ORAL_TABLET | Freq: Two times a day (BID) | ORAL | Status: DC | PRN

## 2015-09-06 NOTE — Telephone Encounter (Signed)
Rx's for November and December printed, awaiting MD signature.

## 2015-09-06 NOTE — Addendum Note (Signed)
Addended by: Dorette GrateFAULKNER, Mayumi Summerson C on: 09/06/2015 04:51 PM   Modules accepted: Orders

## 2015-09-06 NOTE — Telephone Encounter (Signed)
Okay two prescriptions for November and December

## 2015-09-06 NOTE — Telephone Encounter (Signed)
Caller name:Self   Can be reached: [phone]   Reason for call: Requesting refill on amphetamine-dextroamphetamine (ADDERALL) 30 MG tablet [[id number]][[id number]]

## 2015-09-06 NOTE — Telephone Encounter (Signed)
Spoke with Pt, informed him that Rx is ready for pick up at the front desk at his convenience. Pt verbalized understanding.  

## 2015-09-06 NOTE — Telephone Encounter (Signed)
Pt is requesting refill on Adderall.  Last OV: 03/12/2015 Last Fill: 07/05/2015 #60 and 0 RF For September and October 2016 UDS: 03/12/2015 Low risk  Please advise.

## 2015-10-20 ENCOUNTER — Telehealth: Payer: Self-pay | Admitting: Internal Medicine

## 2015-10-20 NOTE — Telephone Encounter (Signed)
Pt never picked up rx printed on 12-23-14, rx shredded

## 2015-11-04 ENCOUNTER — Telehealth: Payer: Self-pay | Admitting: Internal Medicine

## 2015-11-04 DIAGNOSIS — F988 Other specified behavioral and emotional disorders with onset usually occurring in childhood and adolescence: Secondary | ICD-10-CM

## 2015-11-04 NOTE — Telephone Encounter (Signed)
Pt is requesting refill on Adderall.  Last OV: 03/12/2015, F/U scheduled on 11/17/2015 Last Fill: 09/06/2015 #60 and 0RF For November and December 2016 UDS: 03/12/2015 Low risk  Please advise.

## 2015-11-04 NOTE — Telephone Encounter (Signed)
Ok  #60, 2 prescriptions 

## 2015-11-04 NOTE — Telephone Encounter (Signed)
Pt needing adderall refill. Has 5 left. Call when RX ready for pick up at [phone].

## 2015-11-05 MED ORDER — AMPHETAMINE-DEXTROAMPHETAMINE 30 MG PO TABS: 30.0000 mg | ORAL_TABLET | Freq: Two times a day (BID) | ORAL | Status: DC | PRN

## 2015-11-05 NOTE — Telephone Encounter (Signed)
Rx's for January and February 2017 printed, awaiting MD signature.  

## 2015-11-05 NOTE — Telephone Encounter (Signed)
Notified pt that RX is ready at front desk

## 2015-11-05 NOTE — Telephone Encounter (Signed)
Please let Pt know that Rx has been placed at front desk for pick up. Thank you.

## 2015-11-17 ENCOUNTER — Ambulatory Visit (INDEPENDENT_AMBULATORY_CARE_PROVIDER_SITE_OTHER): Payer: PRIVATE HEALTH INSURANCE | Admitting: Internal Medicine

## 2015-11-17 ENCOUNTER — Encounter: Payer: Self-pay | Admitting: Internal Medicine

## 2015-11-17 VITALS — BP 122/80 | HR 100 | Temp 98.1°F | Ht 71.0 in | Wt 214.0 lb

## 2015-11-17 DIAGNOSIS — F909 Attention-deficit hyperactivity disorder, unspecified type: Secondary | ICD-10-CM | POA: Diagnosis not present

## 2015-11-17 DIAGNOSIS — R7989 Other specified abnormal findings of blood chemistry: Secondary | ICD-10-CM

## 2015-11-17 DIAGNOSIS — F988 Other specified behavioral and emotional disorders with onset usually occurring in childhood and adolescence: Secondary | ICD-10-CM

## 2015-11-17 DIAGNOSIS — R945 Abnormal results of liver function studies: Secondary | ICD-10-CM

## 2015-11-17 NOTE — Progress Notes (Signed)
Pre visit review using our clinic review tool, if applicable. No additional management support is needed unless otherwise documented below in the visit note. 

## 2015-11-17 NOTE — Progress Notes (Signed)
   Subjective:    Patient ID: Travis Dunn, male    DOB: 1980-11-11, 35 y.o.   MRN: [id number]  DOS:  11/17/2015 Type of visit - description : Routine visit Interval history: ADD: Symptoms well-controlled LFTs were slightly elevated, the patient denies taking Tylenol, he does enjoy a drink most nights, one or 2 servings.    Review of Systems Denies anxiety, depression. No insomnia No nausea, vomiting or diarrhea  Past Medical History  Diagnosis Date  . ADD (attention deficit disorder)     Past Surgical History  Procedure Laterality Date  . Appendectomy      has abscess    Social History   Social History  . Marital Status: Married    Spouse Name: N/A  . Number of Children: 1  . Years of Education: N/A   Occupational History  . Radio producer company    Social History Main Topics  . Smoking status: Never Smoker   . Smokeless tobacco: Never Used  . Alcohol Use: Yes     Comment: social  . Drug Use: No  . Sexual Activity: Not on file   Other Topics Concern  . Not on file   Social History Narrative   Divorced, has a 49 year old son        Medication List       This list is accurate as of: 11/17/15 11:59 PM.  Always use your most recent med list.               amphetamine-dextroamphetamine 30 MG tablet  Commonly known as:  ADDERALL  Take 1 tablet by mouth 2 (two) times daily as needed.           Objective:   Physical Exam BP 122/80 mmHg  Pulse 100  Temp(Src) 98.1 F (36.7 C) (Oral)  Ht  (1.803 m)  Wt 214 lb (97.07 kg)  BMI 29.86 kg/m2  SpO2 99% General:   Well developed, well nourished . NAD.  HEENT:  Normocephalic . Face symmetric, atraumatic Abdomen:  Not distended, soft, non-tender. No rebound or rigidity. No mass Skin: Not pale. Not jaundice Neurologic:  alert & oriented X3.  Speech normal, gait appropriate for age and unassisted Psych--  Cognition and judgment appear intact.  Cooperative with normal  attention span and concentration.  Behavior appropriate. No anxious or depressed appearing.    Assessment & Plan:   Assessment ADD  PLAN: ADD: Well-controlled, UDS 05-2015 low risk, refill as needed. Increase LFTs: No excessive Tylenol -EtOH per patient. Recheck LFTs, check hep B and C serologies. RTC 6-8 months, recommend to schedule as a physical

## 2015-11-17 NOTE — Patient Instructions (Signed)
BEFORE YOU LEAVE THE OFFICE: GO TO THE LAB  Get the blood work    GO TO THE FRONT DESK Schedule a complete physical exam to be done in 6 to 8 months  Please be fasting

## 2015-11-18 LAB — HEPATITIS C ANTIBODY: HCV Ab: NEGATIVE

## 2015-11-18 LAB — HEPATIC FUNCTION PANEL
ALT: 58 U/L — ABNORMAL HIGH (ref 0–53)
AST: 26 U/L (ref 0–37)
Albumin: 4.3 g/dL (ref 3.5–5.2)
Alkaline Phosphatase: 77 U/L (ref 39–117)
BILIRUBIN DIRECT: 0.1 mg/dL (ref 0.0–0.3)
TOTAL PROTEIN: 7.3 g/dL (ref 6.0–8.3)
Total Bilirubin: 0.6 mg/dL (ref 0.2–1.2)

## 2015-11-18 LAB — HEPATITIS B CORE ANTIBODY, TOTAL: Hep B Core Total Ab: NONREACTIVE

## 2015-11-18 LAB — HEPATITIS B SURFACE ANTIGEN: Hepatitis B Surface Ag: NEGATIVE

## 2016-01-07 ENCOUNTER — Telehealth: Payer: Self-pay | Admitting: Internal Medicine

## 2016-01-07 DIAGNOSIS — F988 Other specified behavioral and emotional disorders with onset usually occurring in childhood and adolescence: Secondary | ICD-10-CM

## 2016-01-07 MED ORDER — AMPHETAMINE-DEXTROAMPHETAMINE 30 MG PO TABS: 30.0000 mg | ORAL_TABLET | Freq: Two times a day (BID) | ORAL | Status: DC | PRN

## 2016-01-07 NOTE — Telephone Encounter (Signed)
Ok 60, 2 Rx

## 2016-01-07 NOTE — Telephone Encounter (Signed)
Pt is requesting refill on Adderall.  Last OV: 11/17/2015 Last Fill: 11/05/2015 #60 and 0RF  UDS: 03/31/2015 Low risk  Please advise.

## 2016-01-07 NOTE — Telephone Encounter (Signed)
Please inform Pt that Rx has been placed at the front desk for pick up at his convenience. Thank you.

## 2016-01-07 NOTE — Telephone Encounter (Signed)
Can be reached: [phone]  Reason for call: pt called for refill on adderall. He has 3-4 left. Please call when rx ready to fill.

## 2016-01-07 NOTE — Telephone Encounter (Signed)
Rx printed, awaiting MD signature.  

## 2016-01-07 NOTE — Telephone Encounter (Signed)
Notified pt. 

## 2016-03-13 ENCOUNTER — Telehealth: Payer: Self-pay | Admitting: Internal Medicine

## 2016-03-13 DIAGNOSIS — F988 Other specified behavioral and emotional disorders with onset usually occurring in childhood and adolescence: Secondary | ICD-10-CM

## 2016-03-13 MED ORDER — AMPHETAMINE-DEXTROAMPHETAMINE 30 MG PO TABS: 30.0000 mg | ORAL_TABLET | Freq: Two times a day (BID) | ORAL | Status: DC | PRN

## 2016-03-13 NOTE — Telephone Encounter (Signed)
Spoke w/ Pt, informed him that Rx's have been placed at the front desk for pick up at his convenience. Pt verbalized understanding.

## 2016-03-13 NOTE — Telephone Encounter (Signed)
Okay #60, 2 Rx

## 2016-03-13 NOTE — Telephone Encounter (Signed)
Rx's for May and June 2017 printed, awaiting MD signature.  

## 2016-03-13 NOTE — Telephone Encounter (Signed)
Relation to EP:PIRJpt:self Call back number:[phone]   Reason for call:  Patient requesting a refill amphetamine-dextroamphetamine (ADDERALL) 30 MG tablet

## 2016-03-13 NOTE — Telephone Encounter (Signed)
Pt is requesting refill on Adderall.  Last OV: 11/17/2015  Last Fill: 01/07/2016 #60 and 0RF For March and April 2017 UDS: 03/12/2015 Low risk  Please advise.

## 2016-05-18 ENCOUNTER — Encounter: Payer: Self-pay | Admitting: Internal Medicine

## 2016-05-18 ENCOUNTER — Telehealth: Payer: Self-pay

## 2016-05-18 ENCOUNTER — Ambulatory Visit (INDEPENDENT_AMBULATORY_CARE_PROVIDER_SITE_OTHER): Payer: PRIVATE HEALTH INSURANCE | Admitting: Internal Medicine

## 2016-05-18 VITALS — BP 118/74 | HR 84 | Temp 97.8°F | Resp 12 | Ht 71.0 in | Wt 213.4 lb

## 2016-05-18 DIAGNOSIS — F988 Other specified behavioral and emotional disorders with onset usually occurring in childhood and adolescence: Secondary | ICD-10-CM

## 2016-05-18 DIAGNOSIS — F909 Attention-deficit hyperactivity disorder, unspecified type: Secondary | ICD-10-CM | POA: Diagnosis not present

## 2016-05-18 MED ORDER — AMPHETAMINE-DEXTROAMPHETAMINE 30 MG PO TABS: 30.0000 mg | ORAL_TABLET | Freq: Two times a day (BID) | ORAL | 0 refills | Status: DC | PRN

## 2016-05-18 NOTE — Progress Notes (Signed)
   Subjective:    Patient ID: Travis Dunn, male    DOB: 14-Dec-1980, 35 y.o.   MRN: [id number]  DOS:  05/18/2016 Type of visit - description : Six-month follow-up Interval history: ADD: Good compliance of medication without apparent side effects.   Review of Systems Denies palpitations, chest pain or difficulty breathing No anxiety or depression Denies insomnia or fatigue   Past Medical History:  Diagnosis Date  . ADD (attention deficit disorder)     Past Surgical History:  Procedure Laterality Date  . APPENDECTOMY     has abscess    Social History   Social History  . Marital status: Married    Spouse name: N/A  . Number of children: 1  . Years of education: N/A   Occupational History  . [employer] producer [employer]    Social History Main Topics  . Smoking status: Never Smoker  . Smokeless tobacco: Never Used  . Alcohol use Yes     Comment: social  . Drug use: No  . Sexual activity: Yes   Other Topics Concern  . Not on file   Social History Narrative   Divorced, has a 15 year old son        Medication List       Accurate as of 05/18/16 11:59 PM. Always use your most recent med list.          amphetamine-dextroamphetamine 30 MG tablet Commonly known as:  ADDERALL Take 1 tablet by mouth 2 (two) times daily as needed.   amphetamine-dextroamphetamine 30 MG tablet Commonly known as:  ADDERALL Take 1 tablet by mouth 2 (two) times daily as needed.          Objective:   Physical Exam BP 118/74 (BP Location: Left Arm, Patient Position: Sitting, Cuff Size: Normal)   Pulse 84   Temp 97.8 F (36.6 C) (Oral)   Resp 12   Ht 5\' 11"  (1.803 m)   Wt 213 lb 6 oz (96.8 kg)   SpO2 99%   BMI 29.76 kg/m  General:   Well developed, well nourished . NAD.  HEENT:  Normocephalic . Face symmetric, atraumatic Lungs:  CTA B Normal respiratory effort, no intercostal retractions, no accessory muscle use. Heart: RRR,  no murmur.  No pretibial  edema bilaterally  Skin: Not pale. Not jaundice Neurologic:  alert & oriented X3.  Speech normal, gait appropriate for age and unassisted Psych--  Cognition and judgment appear intact.  Cooperative with normal attention span and concentration.  Behavior appropriate. No anxious or depressed appearing.      Assessment & Plan:    Assessment ADD Increased LFTs: Hep C and B serology (-) 10-2015  PLAN: ADD: Controlled, check a UDS today, refill medications. RTC 8 months for a checkup although I suggest a physical exam.

## 2016-05-18 NOTE — Patient Instructions (Signed)
GO TO THE LAB : Provide a urine sample for a UDS  GO TO THE FRONT DESK Schedule your next appointment for a  Checkup (or a physical exam) in 8 months  Call for refills as needed

## 2016-05-18 NOTE — Telephone Encounter (Signed)
PA renewal due for Adderall 30 mg tabs. PA initiated via covermymeds; KEY: [id number]. Awaiting determination.

## 2016-05-18 NOTE — Progress Notes (Signed)
Pre visit review using our clinic review tool, if applicable. No additional management support is needed unless otherwise documented below in the visit note. 

## 2016-05-19 DIAGNOSIS — Z09 Encounter for follow-up examination after completed treatment for conditions other than malignant neoplasm: Secondary | ICD-10-CM | POA: Insufficient documentation

## 2016-05-19 NOTE — Assessment & Plan Note (Signed)
ADD: Controlled, check a UDS today, refill medications. RTC 8 months for a checkup although I suggest a physical exam.

## 2016-05-24 NOTE — Telephone Encounter (Signed)
Question asked at time of OV. He informed me that he was diagnosed in college.

## 2016-05-24 NOTE — Telephone Encounter (Signed)
PA denied. Medication denied for medical necessity. Medication authorization require one of the following reasons:  (1) Both of the following:  A) Pt's symptoms have been present prior to 35 years of age; AND  B) Pt's symptoms interfere with or reduce the quality of academic or occupational functioning; OR (2) Prescription is written by or in consultation with a mental health specialist  Information regarding appeal given, letter with above required information needed and mailed:  [employer] Coordinator [address] [address] [place], [place] [address]   Please advise.

## 2016-05-24 NOTE — Telephone Encounter (Addendum)
Will appeal, send a letter.  To whom it may concern Travis Dunn is a patient of mine, I know him for several years, he has the diagnosis of ADD, without medications he really has difficulties concentrating and that affects his ability to work.

## 2016-05-24 NOTE — Telephone Encounter (Signed)
Letter printed and mailed to [employer] coordinator at below address. PA denial sent for scanning.

## 2016-05-24 NOTE — Telephone Encounter (Signed)
The patient has a history of symptoms interfering with his ability to work due to lack of concentration. Please ask the patient if the symptoms started at age 35.

## 2016-05-26 ENCOUNTER — Telehealth: Payer: Self-pay

## 2016-05-26 NOTE — Telephone Encounter (Signed)
UDS: 05/18/2016  Positive for Adderall  Low risk per Dr. Drue Novel 05/26/2016

## 2016-07-03 NOTE — Telephone Encounter (Signed)
Called [facility] at [phone] to check status of appeal on Adderall. Spoke w/ Mitzi DavenportShelby and informed that they had not received information provided which was mailed on 05/24/2016. Forwarded to Greggory StallionGeorge, Financial traderappeal coordinator, appeal not urgent, requested I fax information to [phone]. Awaiting determination. Informed it could take 7 business days.

## 2016-07-04 NOTE — Telephone Encounter (Signed)
Received further information request form from [facility]. Completed and faxed to [facility] at [phone]. Form sent for scanning.

## 2016-07-04 NOTE — Telephone Encounter (Signed)
Received approval through 07/04/2017. PA approval notice sent for scanning.

## 2016-07-04 NOTE — Telephone Encounter (Signed)
Received fax confirmation on 07/04/2016 at 0743.

## 2016-07-18 ENCOUNTER — Telehealth: Payer: Self-pay | Admitting: Internal Medicine

## 2016-07-18 DIAGNOSIS — F988 Other specified behavioral and emotional disorders with onset usually occurring in childhood and adolescence: Secondary | ICD-10-CM

## 2016-07-18 MED ORDER — AMPHETAMINE-DEXTROAMPHETAMINE 30 MG PO TABS: 30.0000 mg | ORAL_TABLET | Freq: Two times a day (BID) | ORAL | 0 refills | Status: DC | PRN

## 2016-07-18 NOTE — Telephone Encounter (Signed)
Okay #60, 2 RXs 

## 2016-07-18 NOTE — Telephone Encounter (Signed)
Please inform Pt that Rx's have been placed at front desk for pick up at his convenience. Thank you.  

## 2016-07-18 NOTE — Telephone Encounter (Signed)
Rx's for October and November 2017, awaiting MD signature.

## 2016-07-18 NOTE — Telephone Encounter (Signed)
Travis [name]-- it looks like prescriptions were printed on 05/18/16 for 06/21/16 and 06/2016? Please advise?  Last rf:  07/19/16, #60 Last OV: 05/18/16 Next OV:  Due 10/2016 UDS: low risk, 05/18/16  Please advise request?

## 2016-07-18 NOTE — Telephone Encounter (Signed)
Pt is requesting refill on Adderall.  Last OV: 05/18/2016 Last Fill: 05/18/2016 #60 and 0RF For August and September 2017 UDS: 05/18/2016 Low risk  Please advise.

## 2016-07-18 NOTE — Telephone Encounter (Signed)
Patient informed that Rx is ready for pick-up.

## 2016-07-18 NOTE — Telephone Encounter (Signed)
Caller name: Relationship to patient: Self Can be reached: [phone]  Pharmacy:  Reason for call: Refill on amphetamine-dextroamphetamine (ADDERALL) 30 MG tablet [[id number]][[id number]]

## 2016-09-19 ENCOUNTER — Telehealth: Payer: Self-pay | Admitting: Internal Medicine

## 2016-09-19 DIAGNOSIS — F988 Other specified behavioral and emotional disorders with onset usually occurring in childhood and adolescence: Secondary | ICD-10-CM

## 2016-09-19 MED ORDER — AMPHETAMINE-DEXTROAMPHETAMINE 30 MG PO TABS: 30.0000 mg | ORAL_TABLET | Freq: Two times a day (BID) | ORAL | 0 refills | Status: DC | PRN

## 2016-09-19 NOTE — Telephone Encounter (Signed)
Patient calling to request refill on Adderall, call patient when ready for pick up

## 2016-09-19 NOTE — Telephone Encounter (Signed)
LMOM informing Pt that Rx has been placed at front desk at his convenience.

## 2016-09-19 NOTE — Telephone Encounter (Signed)
Pt is requesting refill on Adderall.  Last OV: 05/18/2016 Last Fill: 07/18/2016 #30 and 0RF (For October and November 2017) UDS: 05/18/2016 Low risk  Please advise.

## 2016-09-19 NOTE — Telephone Encounter (Signed)
Okay #30, 2 RXs

## 2016-09-19 NOTE — Telephone Encounter (Signed)
Rx's for 09/19/2016 and after, and Rx for 10/19/2016 and after printed, awaiting MD signature.

## 2016-11-27 ENCOUNTER — Telehealth: Payer: Self-pay | Admitting: Internal Medicine

## 2016-11-27 DIAGNOSIS — F988 Other specified behavioral and emotional disorders with onset usually occurring in childhood and adolescence: Secondary | ICD-10-CM

## 2016-11-27 NOTE — Telephone Encounter (Signed)
Pt is requesting refill on Adderall.  Last OV: 05/18/2016  Last Fill: 09/19/2016 #60 and 0RF For November and December 2017 UDS: 05/18/2016 Low risk  Please advise.

## 2016-11-27 NOTE — Telephone Encounter (Signed)
°  Relation to ZO:XWRUpt:self Call back number:[phone]   Reason for call:  Patient requesting a refill amphetamine-dextroamphetamine (ADDERALL) 30 MG tablet

## 2016-11-27 NOTE — Telephone Encounter (Signed)
Ok 60, 2 RFs

## 2016-11-28 MED ORDER — AMPHETAMINE-DEXTROAMPHETAMINE 30 MG PO TABS: 30.0000 mg | ORAL_TABLET | Freq: Two times a day (BID) | ORAL | 0 refills | Status: DC | PRN

## 2016-11-28 NOTE — Telephone Encounter (Signed)
Patient informed. 

## 2016-11-28 NOTE — Telephone Encounter (Signed)
Please inform Pt that Rx has been placed at front desk for pick up. Thank you.  

## 2016-11-28 NOTE — Telephone Encounter (Signed)
Rx's for February and March 2018 printed, awaiting MD signature.  

## 2017-01-09 ENCOUNTER — Ambulatory Visit (INDEPENDENT_AMBULATORY_CARE_PROVIDER_SITE_OTHER): Payer: PRIVATE HEALTH INSURANCE | Admitting: Internal Medicine

## 2017-01-09 ENCOUNTER — Encounter: Payer: Self-pay | Admitting: Internal Medicine

## 2017-01-09 VITALS — BP 116/74 | HR 84 | Temp 98.1°F | Resp 14 | Ht 70.0 in | Wt 222.0 lb

## 2017-01-09 DIAGNOSIS — F988 Other specified behavioral and emotional disorders with onset usually occurring in childhood and adolescence: Secondary | ICD-10-CM

## 2017-01-09 DIAGNOSIS — Z114 Encounter for screening for human immunodeficiency virus [HIV]: Secondary | ICD-10-CM

## 2017-01-09 DIAGNOSIS — R7989 Other specified abnormal findings of blood chemistry: Secondary | ICD-10-CM | POA: Diagnosis not present

## 2017-01-09 DIAGNOSIS — R945 Abnormal results of liver function studies: Secondary | ICD-10-CM

## 2017-01-09 DIAGNOSIS — Z0000 Encounter for general adult medical examination without abnormal findings: Secondary | ICD-10-CM

## 2017-01-09 MED ORDER — AMPHETAMINE-DEXTROAMPHETAMINE 30 MG PO TABS: 30.0000 mg | ORAL_TABLET | Freq: Two times a day (BID) | ORAL | 0 refills | Status: DC | PRN

## 2017-01-09 NOTE — Progress Notes (Signed)
Pre visit review using our clinic review tool, if applicable. No additional management support is needed unless otherwise documented below in the visit note. 

## 2017-01-09 NOTE — Progress Notes (Signed)
   Subjective:    Patient ID: Travis Dunn, male    DOB: 1981/10/04, 36 y.o.   MRN: [id number]  DOS:  01/09/2017 Type of visit - description : cpx Interval history:In general doing well, no major concerns    Review of Systems  A 14 point review of systems is negative   Past Medical History:  Diagnosis Date  . ADD (attention deficit disorder)     Past Surgical History:  Procedure Laterality Date  . APPENDECTOMY     has abscess    Social History   Social History  . Marital status: Divorced    Spouse name: N/A  . Number of children: 1  . Years of education: N/A   Occupational History  . 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 Smoking status: Never Smoker  . Smokeless tobacco: Never Used  . Alcohol use Yes     Comment: social  . Drug use: No  . Sexual activity: Yes   Other Topics Concern  . Not on file   Social History Narrative   Divorced,  Son 2012   Family History  Problem Relation Age of Onset  . Diabetes Father   . Colon cancer Maternal Grandmother   . CAD Maternal Grandfather     MI age 1450s  . Prostate cancer Neg Hx       Allergies as of 01/09/2017   No Known Allergies     Medication List       Accurate as of 01/09/17 11:59 PM. Always use your most recent med list.          amphetamine-dextroamphetamine 30 MG tablet Commonly known as:  ADDERALL Take 1 tablet by mouth 2 (two) times daily as needed.   amphetamine-dextroamphetamine 30 MG tablet Commonly known as:  ADDERALL Take 1 tablet by mouth 2 (two) times daily as needed.          Objective:   Physical Exam BP 116/74 (BP Location: Left Arm, Patient Position: Sitting, Cuff Size: Normal)   Pulse 84   Temp 98.1 F (36.7 C) (Oral)   Resp 14   Ht 5\' 10"  (1.778 m)   Wt 222 lb (100.7 kg)   SpO2 97%   BMI 31.85 kg/m   General:   Well developed, well nourished . NAD.  Neck: No  thyromegaly  HEENT:  Normocephalic . Face symmetric,  atraumatic Lungs:  CTA B Normal respiratory effort, no intercostal retractions, no accessory muscle use. Heart: RRR,  no murmur.  No pretibial edema bilaterally  Abdomen:  Not distended, soft, non-tender. No rebound or rigidity.   Skin: Exposed areas without rash. Not pale. Not jaundice Neurologic:  alert & oriented X3.  Speech normal, gait appropriate for age and unassisted Strength symmetric and appropriate for age.  Psych: Cognition and judgment appear intact.  Cooperative with normal attention span and concentration.  Behavior appropriate. No anxious or depressed appearing.    Assessment & Plan:  Assessment ADD Increased LFTs: Hep C and B serology (-) 10-2015  PLAN:  ADD: Doing great, refills medications today and as needed Increased LFTs: We'll recheck LFTs today, also check ceruloplasmin and alpha 1 antitrypsin. He does not take Tylenol excessively, has usually 1 or 2 beers daily. RTC 8-10 months

## 2017-01-09 NOTE — Assessment & Plan Note (Addendum)
Td 2015; had a flu shot CCS: not indicated Prostate ca screening: no indicated  Diet and exercise discussed Labs: CMP, CBC, TSH, FLP, HIV

## 2017-01-09 NOTE — Patient Instructions (Signed)
GO TO THE LAB : Get the blood work     GO TO THE FRONT DESK Schedule your next appointment for a  Check up in 8-10 months   call for refills as needed         Testicular Self-Exam A self-examination of your testicles (testicular self-exam) involves looking at and feeling your testicles for abnormal lumps or swelling. Several things can cause swelling, lumps, or pain in your testicles. Some of these causes are:  Injuries.  Inflammation.  Infection.  Buildup of fluids around your testicle (hydrocele).  Twisted testicles (testicular torsion).  Testicular cancer. Why is it important to do a testicular self-exam? Self-examination of the testicles and the left and right groin areas may be recommended if you are at risk for testicular cancer. Your groin is where your lower abdomen meets your upper thighs. You may be at risk for testicular cancer if you have:  An undescended testicle (cryptorchidism).  A history of previous testicular cancer.  A family history of testicular cancer. How to do a testicular self-exam The testicles are easiest to examine after a warm bath or shower. They are more difficult to examine when you are cold. This is because the muscles attached to the testicles retract and pull them up higher or into the abdomen. A normal testicle is egg-shaped and feels firm. It is smooth and not tender. The spermatic cord can be felt as a firm, spaghetti-like cord at the back of your testicle. Look and feel for changes   Stand and hold your penis away from your body.  Look at each testicle to check for lumps or swelling.  Roll each testicle between your thumb and forefinger, feeling the entire testicle. Feel for:  Lumps.  Swelling.  Discomfort.  Check the groin area between your abdomen and upper thighs on both sides of your body. Look and feel for any swelling or bumps that are tender. These could be enlarged lymph nodes. Contact a health care provider if:  You  find any bumps or lumps, such as a small, hard, pea-sized lump.  You find swelling, pain, or soreness.  You see or feel any other changes in your testicles. Summary  A self-examination of your testicles (testicular self-exam) involves looking at and feeling your testicles for any changes.  Self-examination of the testicles and the left and right groin areas may be recommended if you are at risk for testicular cancer.  You should check each of your testicles for lumps, swelling, or discomfort.  You should check for swelling or tender bumps in your groin area between your lower abdomen and upper thighs. This information is not intended to replace advice given to you by your health care provider. Make sure you discuss any questions you have with your health care provider. Document Released: 01/15/2001 Document Revised: 09/04/2016 Document Reviewed: 09/04/2016 Elsevier Interactive Patient Education  2017 [employer]    Safe Sex Practicing safe sex means taking steps before and during sex to reduce your risk of:  Getting an STD (sexually transmitted disease).  Giving your partner an STD.  Unwanted pregnancy. How can I practice safe sex?   To practice safe sex:  Limit your sexual partners to only one partner who is having sex with only you.  Avoid using alcohol and recreational drugs before having sex. These substances can affect your judgment.  Before having sex with a new partner:  Talk to your partner about past partners, past STDs, and drug use.  You and  your partner should be screened for STDs and discuss the results with each other.  Check your body regularly for sores, blisters, rashes, or unusual discharge. If you notice any of these problems, visit your health care provider.  If you have symptoms of an infection or you are being treated for an STD, avoid sexual contact.  While having sex, use a condom. Make sure to:  Use a condom every time you have vaginal,  oral, or anal sex. Both females and males should wear condoms during oral sex.  Keep condoms in place from the beginning to the end of sexual activity.  Use a latex condom, if possible. Latex condoms offer the best protection.  Use only water-based lubricants or oils to lubricate a condom. Using petroleum-based lubricants or oils will weaken the condom and increase the chance that it will break.  See your health care provider for regular screenings, exams, and tests for STDs.  Talk with your health care provider about the form of birth control (contraception) that is best for you.  Get vaccinated against hepatitis B and human papillomavirus (HPV).  If you are at risk of being infected with HIV (human immunodeficiency virus), talk with your health care provider about taking a prescription medicine to prevent HIV infection. You are considered at risk for HIV if:  You are a man who has sex with other men.  You are a heterosexual man or woman who is sexually active with more than one partner.  You take drugs by injection.  You are sexually active with a partner who has HIV. This information is not intended to replace advice given to you by your health care provider. Make sure you discuss any questions you have with your health care provider. Document Released: 11/16/2004 Document Revised: 02/23/2016 Document Reviewed: 08/29/2015 Elsevier Interactive Patient Education  2017 [employer].

## 2017-01-10 LAB — COMPREHENSIVE METABOLIC PANEL
ALT: 43 U/L (ref 0–53)
AST: 23 U/L (ref 0–37)
Albumin: 4.6 g/dL (ref 3.5–5.2)
Alkaline Phosphatase: 69 U/L (ref 39–117)
BUN: 21 mg/dL (ref 6–23)
CHLORIDE: 100 meq/L (ref 96–112)
CO2: 29 meq/L (ref 19–32)
Calcium: 10 mg/dL (ref 8.4–10.5)
Creatinine, Ser: 1.13 mg/dL (ref 0.40–1.50)
GFR: 77.58 mL/min (ref >60.00)
GLUCOSE: 76 mg/dL (ref 70–99)
Potassium: 4.3 mEq/L (ref 3.5–5.1)
SODIUM: 137 meq/L (ref 135–145)
Total Bilirubin: 0.6 mg/dL (ref 0.2–1.2)
Total Protein: 7.7 g/dL (ref 6.0–8.3)

## 2017-01-10 LAB — CBC WITH DIFFERENTIAL/PLATELET
BASOS PCT: 1.3 % (ref 0.0–3.0)
Basophils Absolute: 0.1 10*3/uL (ref 0.0–0.1)
EOS PCT: 3.3 % (ref 0.0–5.0)
Eosinophils Absolute: 0.3 10*3/uL (ref 0.0–0.7)
HCT: 47.1 % (ref 39.0–52.0)
HEMOGLOBIN: 16.1 g/dL (ref 13.0–17.0)
LYMPHS ABS: 2 10*3/uL (ref 0.7–4.0)
Lymphocytes Relative: 26.4 % (ref 12.0–46.0)
MCHC: 34.2 g/dL (ref 30.0–36.0)
MCV: 94 fl (ref 78.0–100.0)
MONO ABS: 0.7 10*3/uL (ref 0.1–1.0)
Monocytes Relative: 9.8 % (ref 3.0–12.0)
NEUTROS PCT: 59.2 % (ref 43.0–77.0)
Neutro Abs: 4.5 10*3/uL (ref 1.4–7.7)
Platelets: 309 10*3/uL (ref 150.0–400.0)
RBC: 5.02 Mil/uL (ref 4.22–5.81)
RDW: 12.7 % (ref 11.5–15.5)
WBC: 7.5 10*3/uL (ref 4.0–10.5)

## 2017-01-10 LAB — LIPID PANEL
CHOL/HDL RATIO: 7
CHOLESTEROL: 296 mg/dL — AB (ref 0–200)
HDL: 41.9 mg/dL (ref >39.00)
NonHDL: 253.73
Triglycerides: 390 mg/dL — ABNORMAL HIGH (ref 0.0–149.0)
VLDL: 78 mg/dL — AB (ref 0.0–40.0)

## 2017-01-10 LAB — TSH: TSH: 2.86 u[IU]/mL (ref 0.35–4.50)

## 2017-01-10 LAB — LDL CHOLESTEROL, DIRECT: Direct LDL: 195 mg/dL

## 2017-01-10 NOTE — Assessment & Plan Note (Signed)
ADD: Doing great, refills medications today and as needed Increased LFTs: We'll recheck LFTs today, also check ceruloplasmin and alpha 1 antitrypsin. He does not take Tylenol excessively, has usually 1 or 2 beers daily. RTC 8-10 months

## 2017-01-11 LAB — ALPHA-1-ANTITRYPSIN: A1 ANTITRYPSIN SER: 133 mg/dL (ref 83–199)

## 2017-01-11 LAB — CERULOPLASMIN: Ceruloplasmin: 26 mg/dL (ref 18–36)

## 2017-01-13 LAB — HIV ANTIBODY (ROUTINE TESTING W REFLEX): HIV 1&2 Ab, 4th Generation: NONREACTIVE

## 2017-03-20 ENCOUNTER — Encounter: Payer: Self-pay | Admitting: Family Medicine

## 2017-03-20 ENCOUNTER — Other Ambulatory Visit: Payer: Self-pay

## 2017-03-20 ENCOUNTER — Ambulatory Visit (INDEPENDENT_AMBULATORY_CARE_PROVIDER_SITE_OTHER): Payer: PRIVATE HEALTH INSURANCE | Admitting: Family Medicine

## 2017-03-20 VITALS — BP 133/86 | HR 84 | Temp 99.0°F | Resp 16 | Ht 70.0 in | Wt 219.0 lb

## 2017-03-20 DIAGNOSIS — L03119 Cellulitis of unspecified part of limb: Secondary | ICD-10-CM

## 2017-03-20 DIAGNOSIS — L259 Unspecified contact dermatitis, unspecified cause: Secondary | ICD-10-CM | POA: Diagnosis not present

## 2017-03-20 DIAGNOSIS — F988 Other specified behavioral and emotional disorders with onset usually occurring in childhood and adolescence: Secondary | ICD-10-CM

## 2017-03-20 MED ORDER — PREDNISONE 10 MG PO TABS: ORAL_TABLET | ORAL | 0 refills | Status: DC

## 2017-03-20 MED ORDER — METHYLPREDNISOLONE ACETATE 80 MG/ML IJ SUSP
80.0000 mg | Freq: Once | INTRAMUSCULAR | Status: AC
  Administered 2017-03-20: 80 mg via INTRAMUSCULAR

## 2017-03-20 MED ORDER — AMPHETAMINE-DEXTROAMPHETAMINE 30 MG PO TABS: 30.0000 mg | ORAL_TABLET | Freq: Two times a day (BID) | ORAL | 0 refills | Status: DC | PRN

## 2017-03-20 MED ORDER — CEPHALEXIN 500 MG PO CAPS: 500.0000 mg | ORAL_CAPSULE | Freq: Two times a day (BID) | ORAL | 0 refills | Status: DC

## 2017-03-20 NOTE — Patient Instructions (Signed)
   Contact Dermatitis Dermatitis is redness, soreness, and swelling (inflammation) of the skin. Contact dermatitis is a reaction to certain substances that touch the skin. You either touched something that irritated your skin, or you have allergies to something you touched. Follow these instructions at home: Skin Care   Moisturize your skin as needed.  Apply cool compresses to the affected areas.  Try taking a bath with:  Epsom salts. Follow the instructions on the package. You can get these at a pharmacy or grocery store.  Baking soda. Pour a small amount into the bath as told by your doctor.  Colloidal oatmeal. Follow the instructions on the package. You can get this at a pharmacy or grocery store.  Try applying baking soda paste to your skin. Stir water into baking soda until it looks like paste.  Do not scratch your skin.  Bathe less often.  Bathe in lukewarm water. Avoid using hot water. Medicines   Take or apply over-the-counter and prescription medicines only as told by your doctor.  If you were prescribed an antibiotic medicine, take or apply your antibiotic as told by your doctor. Do not stop taking the antibiotic even if your condition starts to get better. General instructions   Keep all follow-up visits as told by your doctor. This is important.  Avoid the substance that caused your reaction. If you do not know what caused it, keep a journal to try to track what caused it. Write down:  What you eat.  What cosmetic products you use.  What you drink.  What you wear in the affected area. This includes jewelry.  If you were given a bandage (dressing), take care of it as told by your doctor. This includes when to change and remove it. Contact a doctor if:  You do not get better with treatment.  Your condition gets worse.  You have signs of infection such as:  Swelling.  Tenderness.  Redness.  Soreness.  Warmth.  You have a fever.  You have new  symptoms. Get help right away if:  You have a very bad headache.  You have neck pain.  Your neck is stiff.  You throw up (vomit).  You feel very sleepy.  You see red streaks coming from the affected area.  Your bone or joint underneath the affected area becomes painful after the skin has healed.  The affected area turns darker.  You have trouble breathing. This information is not intended to replace advice given to you by your health care provider. Make sure you discuss any questions you have with your health care provider. Document Released: 08/06/2009 Document Revised: 03/16/2016 Document Reviewed: 02/24/2015 Elsevier Interactive Patient Education  2017 [employer]vier Inc.  

## 2017-03-20 NOTE — Progress Notes (Signed)
Patient ID: Milus GlazierJoseph P Hinojosa, male   DOB: 1981-07-12, 36 y.o.   MRN: [id number]     Subjective:  I acted as a Neurosurgeonscribe for Dr. Zola ButtonLowne-Chase.  Apolonio SchneidersSheketia, CMA    Patient ID: Milus GlazierJoseph P Jurczyk, male    DOB: 1981-07-12, 36 y.o.   MRN: [id number]  Chief Complaint  Patient presents with  . Rash    in between toes, top of feet, and now its going up leg    HPI  Patient is in today for rash on both feet in between toes, top of feet, and now moving up legs. He noticed it on Sunday.  He mowed the grass in flip flops Saturday.  Denies exposure to poison ivy, etc  Patient Care Team: Wanda PlumpPaz, Jose E, MD as PCP - General   Past Medical History:  Diagnosis Date  . ADD (attention deficit disorder)     Past Surgical History:  Procedure Laterality Date  . APPENDECTOMY     has abscess    Family History  Problem Relation Age of Onset  . Diabetes Father   . Colon cancer Maternal Grandmother   . CAD Maternal Grandfather        MI age 4250s  . Prostate cancer Neg Hx     Social History   Social History  . Marital status: Divorced    Spouse name: N/A  . Number of children: 1  . Years of education: N/A   Occupational History  . 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 Smoking status: Never Smoker  . Smokeless tobacco: Never Used  . Alcohol use Yes     Comment: social  . Drug use: No  . Sexual activity: Yes   Other Topics Concern  . Not on file   Social History Narrative   Divorced,  Son 2012    Outpatient Medications Prior to Visit  Medication Sig Dispense Refill  . amphetamine-dextroamphetamine (ADDERALL) 30 MG tablet Take 1 tablet by mouth 2 (two) times daily as needed. 60 tablet 0  . amphetamine-dextroamphetamine (ADDERALL) 30 MG tablet Take 1 tablet by mouth 2 (two) times daily as needed. 60 tablet 0   No facility-administered medications prior to visit.     No Known Allergies  Review of Systems  Constitutional: Negative for fever and  malaise/fatigue.  HENT: Negative for congestion.   Eyes: Negative for blurred vision.  Respiratory: Negative for cough and shortness of breath.   Cardiovascular: Negative for chest pain, palpitations and leg swelling.  Gastrointestinal: Negative for vomiting.  Musculoskeletal: Negative for back pain.  Skin: Positive for itching and rash.       Rash on both feet in between toes and on top of foot  Neurological: Negative for loss of consciousness and headaches.       Objective:    Physical Exam  Constitutional: He appears well-developed and well-nourished. No distress.  HENT:  Head: Normocephalic and atraumatic.  Eyes: Conjunctivae are normal.  Neck: Normal range of motion. No thyromegaly present.  Cardiovascular: Normal rate and regular rhythm.   Pulmonary/Chest: Effort normal. He has no wheezes.  Abdominal: Soft. Bowel sounds are normal. There is no tenderness.  Musculoskeletal: Normal range of motion. He exhibits no edema or deformity.  Neurological: He is alert.  Skin: Skin is warm and dry. Rash noted. Rash is vesicular. He is not diaphoretic. There is erythema.     Psychiatric: He has a normal mood and affect.    BP 133/86 (  BP Location: Left Arm, Cuff Size: Normal)   Pulse 84   Temp 99 F (37.2 C) (Oral)   Resp 16   Ht 5\' 10"  (1.778 m)   Wt 219 lb (99.3 kg)   SpO2 98%   BMI 31.42 kg/m  Wt Readings from Last 3 Encounters:  03/20/17 219 lb (99.3 kg)  01/09/17 222 lb (100.7 kg)  05/18/16 213 lb 6 oz (96.8 kg)   BP Readings from Last 3 Encounters:  03/20/17 133/86  01/09/17 116/74  05/18/16 118/74     Immunization History  Administered Date(s) Administered  . Influenza Split 09/04/2011, 07/23/2015  . Influenza,inj,Quad PF,36+ Mos 08/23/2013, 08/21/2014  . Influenza-Unspecified 08/03/2016  . Tdap 11/07/2013    Health Maintenance  Topic Date Due  . INFLUENZA VACCINE  05/23/2017  . TETANUS/TDAP  11/08/2023  . HIV Screening  Completed    Lab Results    Component Value Date   WBC 7.5 01/09/2017   HGB 16.1 01/09/2017   HCT 47.1 01/09/2017   PLT 309.0 01/09/2017   GLUCOSE 76 01/09/2017   CHOL 296 (H) 01/09/2017   TRIG 390.0 (H) 01/09/2017   HDL 41.90 01/09/2017   LDLDIRECT 195.0 01/09/2017   ALT 43 01/09/2017   AST 23 01/09/2017   NA 137 01/09/2017   K 4.3 01/09/2017   CL 100 01/09/2017   CREATININE 1.13 01/09/2017   BUN 21 01/09/2017   CO2 29 01/09/2017   TSH 2.86 01/09/2017    Lab Results  Component Value Date   TSH 2.86 01/09/2017   Lab Results  Component Value Date   WBC 7.5 01/09/2017   HGB 16.1 01/09/2017   HCT 47.1 01/09/2017   MCV 94.0 01/09/2017   PLT 309.0 01/09/2017   Lab Results  Component Value Date   NA 137 01/09/2017   K 4.3 01/09/2017   CO2 29 01/09/2017   GLUCOSE 76 01/09/2017   BUN 21 01/09/2017   CREATININE 1.13 01/09/2017   BILITOT 0.6 01/09/2017   ALKPHOS 69 01/09/2017   AST 23 01/09/2017   ALT 43 01/09/2017   PROT 7.7 01/09/2017   ALBUMIN 4.6 01/09/2017   CALCIUM 10.0 01/09/2017   GFR 77.58 01/09/2017   Lab Results  Component Value Date   CHOL 296 (H) 01/09/2017   Lab Results  Component Value Date   HDL 41.90 01/09/2017   No results found for: [facility] Lab Results  Component Value Date   TRIG 390.0 (H) 01/09/2017   Lab Results  Component Value Date   CHOLHDL 7 01/09/2017   No results found for: HGBA1C       Assessment & Plan:   Problem List Items Addressed This Visit    None    Visit Diagnoses    Contact dermatitis, unspecified contact dermatitis type, unspecified trigger    -  Primary   Relevant Medications   predniSONE (DELTASONE) 10 MG tablet   cephALEXin (KEFLEX) 500 MG capsule   methylPREDNISolone acetate (DEPO-MEDROL) injection 80 mg (Completed)   Cellulitis of foot       Relevant Medications   cephALEXin (KEFLEX) 500 MG capsule      I am having Mr. Kollmann start on predniSONE and cephALEXin. We administered methylPREDNISolone acetate.  Meds ordered  this encounter  Medications  . predniSONE (DELTASONE) 10 MG tablet    Sig: TAKE 3 TABLETS PO QD FOR 3 DAYS THEN TAKE 2 TABLETS PO QD FOR 3 DAYS THEN TAKE 1 TABLET PO QD FOR 3 DAYS THEN TAKE 1/2 TAB PO  QD FOR 3 DAYS    Dispense:  20 tablet    Refill:  0  . cephALEXin (KEFLEX) 500 MG capsule    Sig: Take 1 capsule (500 mg total) by mouth 2 (two) times daily.    Dispense:  20 capsule    Refill:  0  . methylPREDNISolone acetate (DEPO-MEDROL) injection 80 mg    CMA served as scribe during this visit. History, Physical and Plan performed by medical provider. Documentation and orders reviewed and attested to.   Donato Schultz, DO

## 2017-03-20 NOTE — Progress Notes (Signed)
Pt seeing Dr. Laury AxonLowne today, due for Adderall refills, okay per PCP. Rx's for June and July 2018 printed and given to Pt.

## 2017-05-29 ENCOUNTER — Other Ambulatory Visit: Payer: Self-pay | Admitting: Internal Medicine

## 2017-05-29 ENCOUNTER — Encounter: Payer: Self-pay | Admitting: Internal Medicine

## 2017-05-29 DIAGNOSIS — F988 Other specified behavioral and emotional disorders with onset usually occurring in childhood and adolescence: Secondary | ICD-10-CM

## 2017-05-29 MED ORDER — AMPHETAMINE-DEXTROAMPHETAMINE 30 MG PO TABS: 30.0000 mg | ORAL_TABLET | Freq: Two times a day (BID) | ORAL | 0 refills | Status: DC | PRN

## 2017-05-29 NOTE — Telephone Encounter (Signed)
He uses different pharmacies but otherwise no issues. Okay to refill #602

## 2017-05-29 NOTE — Telephone Encounter (Signed)
Pt informed via MyChart that Rx's have been placed at front desk for pick up at his convenience.  

## 2017-05-29 NOTE — Telephone Encounter (Signed)
Pt is requesting refill on Adderall 30mg .  Last OV: 03/20/2017 w/ Lowne; 01/09/2017 w/ PCP Last Fill: 03/20/2017 #60 and 0RF For June and July 2018 UDS: 05/18/2016 Low risk  [place] Controlled Database printed; Pt has been using [facility] [address] [place], [place] and [facility] [address] [place], [place] to [place] Adderall prescriptions, no other issues noted.  Please advise.

## 2017-05-29 NOTE — Telephone Encounter (Signed)
Rx's for August and September 2018 printed, awaiting MD signature.

## 2017-05-30 NOTE — Telephone Encounter (Signed)
To whom it may concern  Gordy LevanJoseph Oren is a patient of mine, I prescribed regularly to him a medication called Adderall which is an amphetamine.  If you have any questions regards this issue don't hesitate to contact me.

## 2017-06-01 ENCOUNTER — Telehealth: Payer: Self-pay | Admitting: Internal Medicine

## 2017-06-01 NOTE — Telephone Encounter (Signed)
FYI  And please advise.

## 2017-06-01 NOTE — Telephone Encounter (Signed)
Pt came in office today to pick up his prescription (Adderall), on rx there was a request for pt to have UDS done, pt was informed had to go to labs to have UDS done, pt got very upset and stated that would like to speak with someone regarding that he already informed his provider that a month ago pt had one done at his job. (Pt even mentioned that requested a letter from Dr Drue NovelPaz about his results at work with the UDS), informed Vicente [name] and Dr Drue NovelPaz about situation and provider approved to give rx to pt this time without having UDS done but will have one in the future, pt was informed ok by provider to take rx for this time without UDS being done but that will have to have one done in the future as part of contract, pt very angry stated this is ridiculous  that none of his friends that gets the same rx does not have to go threw this process of doing a UDS. Pt signed Medication rx pick up book but slammed pen and book on counter and before leaving stated maybe he would need to fine another provider. Then pt left with his rx.

## 2017-06-04 ENCOUNTER — Encounter: Payer: Self-pay | Admitting: Internal Medicine

## 2017-06-04 NOTE — Telephone Encounter (Signed)
Please send this message via my chart Jomarie LongsJoseph, I learned you were very upset last week when we requested a urinary drug screen (UDS).  Please  be aware that we are simply following the law and I  actually consider testing a good medical practice. This time, I decided not to pursue the testing because you had one at work but, I intend to continue requesting  UDSs regularly. If you find this inconvenient, you may consider see another physician (as you already pointed out last week) JP

## 2017-06-04 NOTE — Telephone Encounter (Signed)
error 

## 2017-06-04 NOTE — Telephone Encounter (Signed)
My Chart message sent

## 2017-07-26 ENCOUNTER — Encounter: Payer: Self-pay | Admitting: Internal Medicine

## 2017-07-26 ENCOUNTER — Ambulatory Visit (INDEPENDENT_AMBULATORY_CARE_PROVIDER_SITE_OTHER): Payer: PRIVATE HEALTH INSURANCE | Admitting: Internal Medicine

## 2017-07-26 VITALS — BP 116/72 | HR 93 | Temp 98.3°F | Resp 14 | Ht 70.0 in | Wt 211.5 lb

## 2017-07-26 DIAGNOSIS — J111 Influenza due to unidentified influenza virus with other respiratory manifestations: Secondary | ICD-10-CM | POA: Diagnosis not present

## 2017-07-26 DIAGNOSIS — F988 Other specified behavioral and emotional disorders with onset usually occurring in childhood and adolescence: Secondary | ICD-10-CM | POA: Diagnosis not present

## 2017-07-26 DIAGNOSIS — B349 Viral infection, unspecified: Secondary | ICD-10-CM

## 2017-07-26 LAB — POCT RAPID STREP A (OFFICE): RAPID STREP A SCREEN: NEGATIVE

## 2017-07-26 LAB — POCT INFLUENZA A/B
Influenza A, POC: NEGATIVE
Influenza B, POC: NEGATIVE

## 2017-07-26 MED ORDER — AMPHETAMINE-DEXTROAMPHETAMINE 30 MG PO TABS: 30.0000 mg | ORAL_TABLET | Freq: Two times a day (BID) | ORAL | 0 refills | Status: DC | PRN

## 2017-07-26 NOTE — Progress Notes (Signed)
Pre visit review using our clinic review tool, if applicable. No additional management support is needed unless otherwise documented below in the visit note. 

## 2017-07-26 NOTE — Progress Notes (Signed)
   Subjective:    Patient ID: Travis Dunn, male    DOB: 05/29/1981, 36 y.o.   MRN: [id number]  DOS:  07/26/2017 Type of visit - description : acute Interval history: Symptoms started 11-01-2016: Mild sore throat, generalized aches, malaise, chest congestion, cough with sputum, sputum was greenish looking.  Sore throat and cough are better but he is still aching and feeling poorly.   Review of Systems Denies fever chills No nausea, vomiting, diarrhea No headaches, no rash. Has not been outside this area, no sick contacts that he can tell  Past Medical History:  Diagnosis Date  . ADD (attention deficit disorder)     Past Surgical History:  Procedure Laterality Date  . APPENDECTOMY     has abscess    Social History   Social History  . Marital status: Divorced    Spouse name: N/A  . Number of children: 1  . Years of education: N/A   Occupational History  . Radio producer company    Social History Main Topics  . Smoking status: Never Smoker  . Smokeless tobacco: Never Used  . Alcohol use Yes     Comment: social  . Drug use: No  . Sexual activity: Yes   Other Topics Concern  . Not on file   Social History Narrative   Divorced,  Son 2012      Allergies as of 07/26/2017   No Known Allergies     Medication List       Accurate as of 07/26/17 11:59 PM. Always use your most recent med list.          amphetamine-dextroamphetamine 30 MG tablet Commonly known as:  ADDERALL Take 1 tablet by mouth 2 (two) times daily as needed.   amphetamine-dextroamphetamine 30 MG tablet Commonly known as:  ADDERALL Take 1 tablet by mouth 2 (two) times daily as needed.          Objective:   Physical Exam BP 116/72 (BP Location: Left Arm, Patient Position: Sitting, Cuff Size: Small)   Pulse 93   Temp 98.3 F (36.8 C) (Oral)   Resp 14   Ht  (1.778 m)   Wt 211 lb 8 oz (95.9 kg)   SpO2 98%   BMI 30.35 kg/m  General:   Well developed, well  nourished, looks slightly tired but not toxic appearing and in no distress.  HEENT:  Normocephalic . Face symmetric, atraumatic. TMs normal, throat without 1 patches or redness. Nose not congested. Lungs:  CTA B Normal respiratory effort, no intercostal retractions, no accessory muscle use. Heart: RRR,  no murmur.  No pretibial edema bilaterally  Skin: Not pale. Not jaundice Neurologic:  alert & oriented X3.  Speech normal, gait appropriate for age and unassisted Psych--  Cognition and judgment appear intact.  Cooperative with normal attention span and concentration.  Behavior appropriate. No anxious or depressed appearing.      Assessment & Plan:   Assessment ADD Increased LFTs: Hep C and B serology (-) 10-2015. 12-2016: wnl a-1-antitrypsin/ceruloplasmine  PLAN:  Viral syndrome: Strep test and flu test negative. Rec conservative treatment. See instructions. ADD: See phone note from 06/01/2017, we talk about that, I think he understood my reasoning behind checking UDSs, cost of UDS was part of the problem Plan: Refill Adderall. UDS at the next office visit by January or February 2019 RTC ~ 1 or 2 /2019

## 2017-07-26 NOTE — Patient Instructions (Signed)
GO TO THE FRONT DESK Schedule your next appointment for a routine checkup by January or February 2019.  Call for refills of Adderall as needed until next visit     ===================== Rest, fluids , tylenol  If cough:  Take Mucinex DM twice a day as needed until better  If nasal congestion: Use OTC   Flonase : 2 nasal sprays on each side of the nose in the morning until you feel better    Call if not gradually better over the next  10 days  Call anytime if the symptoms are severe, you have high fever, short of breath, chest pain, headache

## 2017-07-27 NOTE — Assessment & Plan Note (Signed)
Viral syndrome: Strep test and flu test negative. Rec conservative treatment. See instructions. ADD: See phone note from 06/01/2017, we talk about that, I think he understood my reasoning behind checking UDSs, cost of UDS was part of the problem Plan: Refill Adderall. UDS at the next office visit by January or February 2019 RTC ~ 1 or 2 /2019

## 2017-08-06 ENCOUNTER — Telehealth: Payer: Self-pay

## 2017-08-06 NOTE — Telephone Encounter (Signed)
Request Reference Number: [id number]. AMPHET/DEXTR TAB  is approved through 08/06/2018. For further questions, call [phone]

## 2017-08-06 NOTE — Telephone Encounter (Signed)
PA initiated via Covermymeds; KEY: [id number]. Awaiting determination.

## 2017-10-02 ENCOUNTER — Telehealth: Payer: Self-pay | Admitting: Internal Medicine

## 2017-10-02 DIAGNOSIS — F988 Other specified behavioral and emotional disorders with onset usually occurring in childhood and adolescence: Secondary | ICD-10-CM

## 2017-10-02 NOTE — Telephone Encounter (Signed)
Copied from CRM [id number]. Topic: Quick Communication - Rx Refill/Question >> Oct 02, 2017  1:56 PM Elliot GaultBell, Tiffany M wrote: Jethro Bolusaller name: Relation to pt: Call back number: Pharmacy:  Reason for call:  Patient requesting amphetamine-dextroamphetamine (ADDERALL) 30 MG tablet, informed patient office is closed today and please allow 72 hour turn around time.

## 2017-10-03 MED ORDER — AMPHETAMINE-DEXTROAMPHETAMINE 30 MG PO TABS: 30.0000 mg | ORAL_TABLET | Freq: Two times a day (BID) | ORAL | 0 refills | Status: DC | PRN

## 2017-10-03 NOTE — Telephone Encounter (Signed)
Pt is requesting refill on Adderall.   Last OV: 07/26/2017  Last Fill: 07/26/2017 #60 and 0RF (For October And November 2018) UDS: 05/18/2016 Low risk (this is the patient that had a problem giving UDS)  [place] website down- will try again later  Please advise.

## 2017-10-03 NOTE — Telephone Encounter (Signed)
sent 

## 2017-10-03 NOTE — Telephone Encounter (Signed)
Adderall for December 2018 and January 2019 pended below. Can you send electronically please?

## 2017-10-03 NOTE — Telephone Encounter (Signed)
Okay to refill two prescription, no UDS till next  visit

## 2017-11-12 ENCOUNTER — Other Ambulatory Visit: Payer: Self-pay | Admitting: Internal Medicine

## 2017-11-12 DIAGNOSIS — F988 Other specified behavioral and emotional disorders with onset usually occurring in childhood and adolescence: Secondary | ICD-10-CM

## 2017-12-13 ENCOUNTER — Other Ambulatory Visit: Payer: Self-pay | Admitting: Internal Medicine

## 2017-12-13 DIAGNOSIS — F988 Other specified behavioral and emotional disorders with onset usually occurring in childhood and adolescence: Secondary | ICD-10-CM

## 2017-12-14 MED ORDER — AMPHETAMINE-DEXTROAMPHETAMINE 30 MG PO TABS: 30.0000 mg | ORAL_TABLET | Freq: Two times a day (BID) | ORAL | 0 refills | Status: DC | PRN

## 2017-12-14 NOTE — Telephone Encounter (Signed)
We will do a UDS at the next  Visit He is due for a office visit, send him a note, ask him to schedule one.  Refills sent.

## 2017-12-14 NOTE — Telephone Encounter (Signed)
Pt is requesting refill on Adderall.   Last OV: 07/26/2017 Last Fill: 10/03/2017 #60 and 0RF (For December and January 2019)  UDS: 05/18/2016 Low risk (this is the Pt that had a problem giving UDS)  [place] printed- no issues noted  Please advise.

## 2017-12-19 ENCOUNTER — Encounter: Payer: Self-pay | Admitting: Internal Medicine

## 2017-12-19 DIAGNOSIS — F988 Other specified behavioral and emotional disorders with onset usually occurring in childhood and adolescence: Secondary | ICD-10-CM

## 2017-12-20 MED ORDER — AMPHETAMINE-DEXTROAMPHETAMINE 30 MG PO TABS: 30.0000 mg | ORAL_TABLET | Freq: Two times a day (BID) | ORAL | 0 refills | Status: DC | PRN

## 2017-12-20 NOTE — Telephone Encounter (Signed)
Pended below for [facility] on [address]

## 2018-01-23 ENCOUNTER — Other Ambulatory Visit: Payer: Self-pay | Admitting: Internal Medicine

## 2018-01-23 DIAGNOSIS — F988 Other specified behavioral and emotional disorders with onset usually occurring in childhood and adolescence: Secondary | ICD-10-CM

## 2018-01-24 MED ORDER — AMPHETAMINE-DEXTROAMPHETAMINE 30 MG PO TABS: 30.0000 mg | ORAL_TABLET | Freq: Two times a day (BID) | ORAL | 0 refills | Status: DC | PRN

## 2018-01-24 NOTE — Telephone Encounter (Signed)
My Chart message sent

## 2018-01-24 NOTE — Telephone Encounter (Signed)
Pt is requesting refill on Adderall 30mg . [facility] [address] [facility]- he is [facility] to [facility] to [facility] [address] in [place].   Last OV: 07/26/2017, no appt scheduled (due for visit) Last Fill: 12/20/2017 #60 and 0rf (For February and March 2019) UDS: 05/18/2016 Low risk  Due for UDS and contract at next OV  [place] in media from 12/14/2017- no discrepancies noted.   Please advise.

## 2018-01-24 NOTE — Telephone Encounter (Signed)
Prescription sent. Send him a Well[facility] geologistmychart message, due for a visit

## 2018-03-01 ENCOUNTER — Encounter: Payer: Self-pay | Admitting: Internal Medicine

## 2018-03-01 ENCOUNTER — Ambulatory Visit (INDEPENDENT_AMBULATORY_CARE_PROVIDER_SITE_OTHER): Payer: PRIVATE HEALTH INSURANCE | Admitting: Internal Medicine

## 2018-03-01 VITALS — BP 128/68 | HR 81 | Temp 98.0°F | Resp 14 | Ht 70.0 in | Wt 216.5 lb

## 2018-03-01 DIAGNOSIS — F988 Other specified behavioral and emotional disorders with onset usually occurring in childhood and adolescence: Secondary | ICD-10-CM

## 2018-03-01 DIAGNOSIS — Z79899 Other long term (current) drug therapy: Secondary | ICD-10-CM | POA: Diagnosis not present

## 2018-03-01 MED ORDER — AMPHETAMINE-DEXTROAMPHETAMINE 30 MG PO TABS: 30.0000 mg | ORAL_TABLET | Freq: Two times a day (BID) | ORAL | 0 refills | Status: DC | PRN

## 2018-03-01 NOTE — Patient Instructions (Signed)
GO TO THE LAB : Provide a urine sample      GO TO THE FRONT DESK Schedule your next appointment for a checkup in 8 months

## 2018-03-01 NOTE — Progress Notes (Signed)
Subjective:    Patient ID: Travis Dunn, male    DOB: June 28, 1981, 37 y.o.   MRN: [id number]  DOS:  03/01/2018 Type of visit - description : Routine visit Interval history: Here for a ADD checkup.  Doing well.  Good compliance w/ meds  without apparent side effects.  Review of Systems A lot of the stress at work but denies anxiety, depression per se.   Past Medical History:  Diagnosis Date  . ADD (attention deficit disorder)     Past Surgical History:  Procedure Laterality Date  . APPENDECTOMY     has abscess    Social History   Socioeconomic History  . Marital status: Divorced    Spouse name: Not on file  . Number of children: 1  . Years of education: Not on file  . Highest education level: Not on file  Occupational History  . Occupation: Radio producer company  Social Needs  . Financial resource strain: Not on file  . Food insecurity:    Worry: Not on file    Inability: Not on file  . Transportation needs:    Medical: Not on file    Non-medical: Not on file  Tobacco Use  . Smoking status: Never Smoker  . Smokeless tobacco: Never Used  Substance and Sexual Activity  . Alcohol use: Yes    Comment: social  . Drug use: No  . Sexual activity: Yes  Lifestyle  . Physical activity:    Days per week: Not on file    Minutes per session: Not on file  . Stress: Not on file  Relationships  . Social connections:    Talks on phone: Not on file    Gets together: Not on file    Attends religious service: Not on file    Active member of club or organization: Not on file    Attends meetings of clubs or organizations: Not on file    Relationship status: Not on file  . Intimate partner violence:    Fear of current or ex partner: Not on file    Emotionally abused: Not on file    Physically abused: Not on file    Forced sexual activity: Not on file  Other Topics Concern  . Not on file  Social History Narrative   Divorced,  Son 2012       Allergies as of 03/01/2018   No Known Allergies     Medication List        Accurate as of 03/01/18  3:47 PM. Always use your most recent med list.          amphetamine-dextroamphetamine 30 MG tablet Commonly known as:  ADDERALL Take 1 tablet by mouth 2 (two) times daily as needed.          Objective:   Physical Exam BP 128/68 (BP Location: Left Arm, Patient Position: Sitting, Cuff Size: Small)   Pulse 81   Temp 98 F (36.7 C) (Oral)   Resp 14   Ht  (1.778 m)   Wt 216 lb 8 oz (98.2 kg)   SpO2 98%   BMI 31.06 kg/m  General:   Well developed, well nourished . NAD.  HEENT:  Normocephalic . Face symmetric, atraumatic Lungs:  CTA B Normal respiratory effort, no intercostal retractions, no accessory muscle use. Heart: RRR,  no murmur.  No pretibial edema bilaterally  Skin: Not pale. Not jaundice Neurologic:  alert & oriented X3.  Speech normal, gait appropriate for  age and unassisted Psych--  Cognition and judgment appear intact.  Cooperative with normal attention span and concentration.  Behavior appropriate. No anxious or depressed appearing.      Assessment & Plan:   ssessment ADD Increased LFTs: Hep C and B serology (-) 10-2015. 12-2016: wnl a-1-antitrypsin/ceruloplasmine  PLAN:  ADD: Well-controlled on Adderall 30 mg twice a day.  UDS and contract today.  Seems to be doing well, follow-up in 8 months.

## 2018-03-01 NOTE — Progress Notes (Signed)
Pre visit review using our clinic review tool, if applicable. No additional management support is needed unless otherwise documented below in the visit note. 

## 2018-03-02 NOTE — Assessment & Plan Note (Signed)
  ADD: Well-controlled on Adderall 30 mg twice a day.  UDS and contract today.  Seems to be doing well, follow-up in 8 months.

## 2018-03-29 ENCOUNTER — Other Ambulatory Visit: Payer: Self-pay | Admitting: Internal Medicine

## 2018-03-29 DIAGNOSIS — F988 Other specified behavioral and emotional disorders with onset usually occurring in childhood and adolescence: Secondary | ICD-10-CM

## 2018-03-29 MED ORDER — AMPHETAMINE-DEXTROAMPHETAMINE 30 MG PO TABS: 30.0000 mg | ORAL_TABLET | Freq: Two times a day (BID) | ORAL | 0 refills | Status: DC | PRN

## 2018-03-29 NOTE — Telephone Encounter (Signed)
Requesting:Adderall Contract:03/01/18 UDS:05/18/16 needs updated uds Last Visit:03/01/18 Next Visit:none with pcp Last Refill:03/01/18  No discrepancies  Please Advise

## 2018-04-30 ENCOUNTER — Telehealth: Payer: Self-pay | Admitting: Internal Medicine

## 2018-04-30 DIAGNOSIS — F988 Other specified behavioral and emotional disorders with onset usually occurring in childhood and adolescence: Secondary | ICD-10-CM

## 2018-04-30 MED ORDER — AMPHETAMINE-DEXTROAMPHETAMINE 30 MG PO TABS: 30.0000 mg | ORAL_TABLET | Freq: Two times a day (BID) | ORAL | 0 refills | Status: DC | PRN

## 2018-04-30 NOTE — Telephone Encounter (Signed)
Pt is requesting refill on Adderall.   Last OV: 03/01/2018 Last Fill: 03/29/2018 #60 and 0RF UDS: Did not stop in lab on 03/01/2018 for UDS  Please advise.

## 2018-04-30 NOTE — Telephone Encounter (Signed)
Sent!

## 2018-06-04 ENCOUNTER — Other Ambulatory Visit: Payer: Self-pay | Admitting: Internal Medicine

## 2018-06-04 DIAGNOSIS — F988 Other specified behavioral and emotional disorders with onset usually occurring in childhood and adolescence: Secondary | ICD-10-CM

## 2018-06-04 NOTE — Telephone Encounter (Signed)
Pt is requesting refill on Adderall. Paz Pt.   Last OV: 03/01/2018 Last Fill: 04/30/2018 #30 AND 0RF UDS: 05/18/2016 Low risk CSC: 03/01/2018

## 2018-06-05 MED ORDER — AMPHETAMINE-DEXTROAMPHETAMINE 30 MG PO TABS: 30.0000 mg | ORAL_TABLET | Freq: Two times a day (BID) | ORAL | 0 refills | Status: DC | PRN

## 2018-07-08 ENCOUNTER — Telehealth: Payer: Self-pay | Admitting: Family

## 2018-07-08 DIAGNOSIS — F988 Other specified behavioral and emotional disorders with onset usually occurring in childhood and adolescence: Secondary | ICD-10-CM

## 2018-07-08 MED ORDER — AMPHETAMINE-DEXTROAMPHETAMINE 30 MG PO TABS: 30.0000 mg | ORAL_TABLET | Freq: Two times a day (BID) | ORAL | 0 refills | Status: DC | PRN

## 2018-07-08 NOTE — Telephone Encounter (Signed)
Sent x2  

## 2018-07-08 NOTE — Telephone Encounter (Signed)
Pt is requesting refill on Adderall 30mg .   Last OV: 03/29/2018 Last Fill: 06/05/2018 #60 and 0RF UDS: 05/18/2016 Low risk  [place]CR in media from 03/07/2018- no discrepancies noted

## 2018-08-06 ENCOUNTER — Telehealth: Payer: Self-pay | Admitting: Internal Medicine

## 2018-08-06 DIAGNOSIS — F988 Other specified behavioral and emotional disorders with onset usually occurring in childhood and adolescence: Secondary | ICD-10-CM

## 2018-08-07 ENCOUNTER — Telehealth: Payer: Self-pay

## 2018-08-07 ENCOUNTER — Encounter: Payer: Self-pay | Admitting: Internal Medicine

## 2018-08-07 MED ORDER — AMPHETAMINE-DEXTROAMPHETAMINE 30 MG PO TABS: 30.0000 mg | ORAL_TABLET | Freq: Two times a day (BID) | ORAL | 0 refills | Status: DC | PRN

## 2018-08-07 NOTE — Telephone Encounter (Signed)
PA approved.  Reference Number: [id number]. AMPHET/DEXTR TAB 30MG  is approved through 08/08/2019. For further questions, call [phone]

## 2018-08-07 NOTE — Telephone Encounter (Signed)
Sent x2  

## 2018-08-07 NOTE — Telephone Encounter (Signed)
Pt is requesting refill on Adderall.   Last OV: 03/01/2018 Last Fill: 07/08/2018 #60 and 0RF UDS: 03/13/2016 Low risk  [place]CR printed- no discrepancies noted- sent for scanning

## 2018-08-07 NOTE — Telephone Encounter (Signed)
PA initiated via Covermymeds; KEY: [id number]. Awaiting determination.

## 2018-09-13 ENCOUNTER — Telehealth: Payer: Self-pay | Admitting: Internal Medicine

## 2018-09-13 DIAGNOSIS — F988 Other specified behavioral and emotional disorders with onset usually occurring in childhood and adolescence: Secondary | ICD-10-CM

## 2018-09-13 MED ORDER — AMPHETAMINE-DEXTROAMPHETAMINE 30 MG PO TABS: 30.0000 mg | ORAL_TABLET | Freq: Two times a day (BID) | ORAL | 0 refills | Status: DC | PRN

## 2018-09-13 NOTE — Telephone Encounter (Signed)
Pt is requesting refill on Adderall.   Last OV: 03/29/2018 Last Fill: 08/07/2018 #60 and 0RF UDS: 03/13/2016 Low risk  [place] in media from 08/07/2018

## 2018-09-13 NOTE — Telephone Encounter (Signed)
sent x 2

## 2018-10-11 ENCOUNTER — Telehealth: Payer: Self-pay | Admitting: Internal Medicine

## 2018-10-11 DIAGNOSIS — F988 Other specified behavioral and emotional disorders with onset usually occurring in childhood and adolescence: Secondary | ICD-10-CM

## 2018-10-11 MED ORDER — AMPHETAMINE-DEXTROAMPHETAMINE 30 MG PO TABS: 30.0000 mg | ORAL_TABLET | Freq: Two times a day (BID) | ORAL | 0 refills | Status: DC | PRN

## 2018-10-11 NOTE — Telephone Encounter (Signed)
Sent x2  

## 2018-10-11 NOTE — Telephone Encounter (Signed)
Pt is requesting refill on Adderall.   Last OV: 03/01/2018 Last Fill: 09/13/2018 #60 and 0RF UDS: 03/13/2016 Low risk  [place]CR in media from 08/07/2018

## 2018-11-13 ENCOUNTER — Telehealth: Payer: Self-pay | Admitting: Internal Medicine

## 2018-11-13 DIAGNOSIS — F988 Other specified behavioral and emotional disorders with onset usually occurring in childhood and adolescence: Secondary | ICD-10-CM

## 2018-11-13 MED ORDER — AMPHETAMINE-DEXTROAMPHETAMINE 30 MG PO TABS: 30.0000 mg | ORAL_TABLET | Freq: Two times a day (BID) | ORAL | 0 refills | Status: DC | PRN

## 2018-11-13 NOTE — Telephone Encounter (Signed)
Pt is requesting refill on Adderall.   Last OV: 03/01/2018, Pt overdue for visit Last Fill: 10/11/2018 #60 and 0RF UDS: 05/18/2016 Low risk  Needs UDS and contract at next OV  [place]CR 08/07/2018 in media

## 2018-11-13 NOTE — Telephone Encounter (Signed)
MyChart message sent to Pt informing that Rx has been sent however he is due for a visit before the next refill.

## 2018-11-13 NOTE — Telephone Encounter (Signed)
Prescription was sent

## 2018-11-18 ENCOUNTER — Ambulatory Visit: Payer: Self-pay | Admitting: *Deleted

## 2018-11-18 ENCOUNTER — Ambulatory Visit (INDEPENDENT_AMBULATORY_CARE_PROVIDER_SITE_OTHER): Payer: PRIVATE HEALTH INSURANCE | Admitting: Internal Medicine

## 2018-11-18 ENCOUNTER — Encounter: Payer: Self-pay | Admitting: Internal Medicine

## 2018-11-18 ENCOUNTER — Ambulatory Visit (HOSPITAL_BASED_OUTPATIENT_CLINIC_OR_DEPARTMENT_OTHER)
Admission: RE | Admit: 2018-11-18 | Discharge: 2018-11-18 | Disposition: A | Discharge status: Home / Self Care | Payer: PRIVATE HEALTH INSURANCE | Source: Ambulatory Visit | Attending: Internal Medicine | Admitting: Internal Medicine

## 2018-11-18 VITALS — BP 128/80 | HR 90 | Temp 98.4°F | Resp 16 | Ht 70.0 in | Wt 222.4 lb

## 2018-11-18 DIAGNOSIS — S4991XA Unspecified injury of right shoulder and upper arm, initial encounter: Secondary | ICD-10-CM

## 2018-11-18 DIAGNOSIS — F988 Other specified behavioral and emotional disorders with onset usually occurring in childhood and adolescence: Secondary | ICD-10-CM

## 2018-11-18 NOTE — Patient Instructions (Addendum)
GO TO THE LAB : Provide a urine sample      GO TO THE FRONT DESK Schedule your next appointment for  physical exam in 6 months, fasting  STOP BY THE FIRST FLOOR:  get the XR   Okay to take ibuprofen in the next few days or Tylenol. Please see below instructions below.  IBUPROFEN (Advil or Motrin) 200 mg 2 tablets every 8 hours as needed for pain.  Always take it with food because may cause gastritis and ulcers.  If you notice nausea, stomach pain, change in the color of stools --->  Stop the medicine and let us know  Tylenol  500 mg OTC 2 tabs a day every 8 hours as needed for pain   Ice pack on the top of your right shoulder at night  Call if not gradually better in the next 10 days for a referral

## 2018-11-18 NOTE — Progress Notes (Signed)
Pre visit review using our clinic review tool, if applicable. No additional management support is needed unless otherwise documented below in the visit note. 
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
## 2018-11-19 NOTE — Assessment & Plan Note (Signed)
Shoulder contusion: Suspect contusion, less likely internal derangement or a fracture.  We will get a x-ray and otherwise treat conservatively with rest, ice, ibuprofen or Tylenol.  GI precautions discussed. Safety discussed, needs to be very careful.  Avoid reinjury of the shoulder in the next several weeks.  No motorcycling. Call if not better, Ortho versus a sports medicine referral ADD: UDS and contract today RTC 6 months CPX

## 2018-11-21 LAB — PAIN MGMT, PROFILE 8 W/CONF, U
6 Acetylmorphine: NEGATIVE ng/mL (ref <10)
AMPHETAMINES: POSITIVE ng/mL — AB (ref <500)
Alcohol Metabolites: POSITIVE ng/mL — AB (ref <500)
Amphetamine: 24000 ng/mL — ABNORMAL HIGH (ref <250)
Benzodiazepines: NEGATIVE ng/mL (ref <100)
Buprenorphine, Urine: NEGATIVE ng/mL (ref <5)
Cocaine Metabolite: NEGATIVE ng/mL (ref <150)
Creatinine: 243.5 mg/dL
Ethyl Glucuronide (ETG): 993 ng/mL — ABNORMAL HIGH (ref <500)
Ethyl Sulfate (ETS): 512 ng/mL — ABNORMAL HIGH (ref <100)
MARIJUANA METABOLITE: NEGATIVE ng/mL (ref <20)
MDMA: NEGATIVE ng/mL (ref <500)
Methamphetamine: NEGATIVE ng/mL (ref <250)
Opiates: NEGATIVE ng/mL (ref <100)
Oxidant: NEGATIVE ug/mL (ref <200)
Oxycodone: NEGATIVE ng/mL (ref <100)
pH: 6.34 (ref 4.5–9.0)

## 2018-11-26 ENCOUNTER — Other Ambulatory Visit: Payer: Self-pay | Admitting: Internal Medicine

## 2018-11-26 DIAGNOSIS — F988 Other specified behavioral and emotional disorders with onset usually occurring in childhood and adolescence: Secondary | ICD-10-CM

## 2018-11-26 MED ORDER — AMPHETAMINE-DEXTROAMPHETAMINE 30 MG PO TABS: 30.0000 mg | ORAL_TABLET | Freq: Two times a day (BID) | ORAL | 0 refills | Status: DC | PRN

## 2018-11-26 NOTE — Telephone Encounter (Signed)
Sent!

## 2018-11-26 NOTE — Telephone Encounter (Signed)
See Pt message below:    The [facility] on [address] Doesn't have any adderal in stock. Can you please try calling it in to the [facility]

## 2018-12-29 ENCOUNTER — Telehealth: Payer: Self-pay | Admitting: Internal Medicine

## 2018-12-29 DIAGNOSIS — F988 Other specified behavioral and emotional disorders with onset usually occurring in childhood and adolescence: Secondary | ICD-10-CM

## 2018-12-30 MED ORDER — AMPHETAMINE-DEXTROAMPHETAMINE 30 MG PO TABS: 30.0000 mg | ORAL_TABLET | Freq: Two times a day (BID) | ORAL | 0 refills | Status: DC | PRN

## 2018-12-30 NOTE — Telephone Encounter (Signed)
Pt is requesting refill on Adderall.   Last OV: 11/18/2018 Last Fill: 11/26/2018 #60 and 0RF UDS: 11/18/2018 Low risk

## 2018-12-30 NOTE — Telephone Encounter (Signed)
Prescription sent x2, next visit should be by 04-2019

## 2019-01-31 ENCOUNTER — Telehealth: Payer: Self-pay | Admitting: Internal Medicine

## 2019-01-31 DIAGNOSIS — F988 Other specified behavioral and emotional disorders with onset usually occurring in childhood and adolescence: Secondary | ICD-10-CM

## 2019-02-03 MED ORDER — AMPHETAMINE-DEXTROAMPHETAMINE 30 MG PO TABS: 30.0000 mg | ORAL_TABLET | Freq: Two times a day (BID) | ORAL | 0 refills | Status: DC | PRN

## 2019-02-03 NOTE — Telephone Encounter (Signed)
Sent!

## 2019-02-03 NOTE — Telephone Encounter (Signed)
Pt is requesting refill on Adderall.   Last OV: 11/18/2018 Last Fill: 12/30/2018 #60 and 0RF UDS: 11/18/2018 Low risk

## 2019-03-07 ENCOUNTER — Telehealth: Payer: Self-pay | Admitting: Internal Medicine

## 2019-03-07 DIAGNOSIS — F988 Other specified behavioral and emotional disorders with onset usually occurring in childhood and adolescence: Secondary | ICD-10-CM

## 2019-03-07 MED ORDER — AMPHETAMINE-DEXTROAMPHETAMINE 30 MG PO TABS: 30.0000 mg | ORAL_TABLET | Freq: Two times a day (BID) | ORAL | 0 refills | Status: DC | PRN

## 2019-03-07 NOTE — Telephone Encounter (Signed)
Sent x2  

## 2019-03-07 NOTE — Telephone Encounter (Signed)
Adderall refill.   Last OV: 11/18/2018 Last Fill: 02/03/2019 #60 and 0RF UDS: 11/18/2018 Low risk

## 2019-04-07 ENCOUNTER — Telehealth: Payer: Self-pay | Admitting: Internal Medicine

## 2019-04-07 DIAGNOSIS — F988 Other specified behavioral and emotional disorders with onset usually occurring in childhood and adolescence: Secondary | ICD-10-CM

## 2019-04-07 NOTE — Telephone Encounter (Signed)
Adderall refill.   Last OV: 11/18/2018 Last Fill: 03/07/2019 #60 and 0RF UDS: 11/18/2018 Low risk

## 2019-04-08 MED ORDER — AMPHETAMINE-DEXTROAMPHETAMINE 30 MG PO TABS: 30.0000 mg | ORAL_TABLET | Freq: Two times a day (BID) | ORAL | 0 refills | Status: DC | PRN

## 2019-04-08 NOTE — Telephone Encounter (Signed)
Sent x2  

## 2019-05-09 ENCOUNTER — Telehealth: Payer: Self-pay | Admitting: Internal Medicine

## 2019-05-09 DIAGNOSIS — F988 Other specified behavioral and emotional disorders with onset usually occurring in childhood and adolescence: Secondary | ICD-10-CM

## 2019-05-09 MED ORDER — AMPHETAMINE-DEXTROAMPHETAMINE 30 MG PO TABS: 30.0000 mg | ORAL_TABLET | Freq: Two times a day (BID) | ORAL | 0 refills | Status: DC | PRN

## 2019-05-09 NOTE — Telephone Encounter (Signed)
Adderall refill.   Last OV: 11/18/2018 Last Fill: 04/08/2019 #60 and 0RF UDS: 11/18/2018 Low risk

## 2019-05-09 NOTE — Telephone Encounter (Signed)
Sent x2  

## 2019-05-28 ENCOUNTER — Encounter: Payer: Self-pay | Admitting: Internal Medicine

## 2019-06-12 ENCOUNTER — Other Ambulatory Visit: Payer: Self-pay | Admitting: Internal Medicine

## 2019-06-12 DIAGNOSIS — F988 Other specified behavioral and emotional disorders with onset usually occurring in childhood and adolescence: Secondary | ICD-10-CM

## 2019-06-12 MED ORDER — AMPHETAMINE-DEXTROAMPHETAMINE 30 MG PO TABS: 30.0000 mg | ORAL_TABLET | Freq: Two times a day (BID) | ORAL | 0 refills | Status: DC | PRN

## 2019-06-12 NOTE — Telephone Encounter (Signed)
Prescription sent x2.  Please send the patient a message, he is due for a visit at his convenience

## 2019-06-12 NOTE — Telephone Encounter (Signed)
Adderall Rx.   Last OV: 11/18/2018 Last Fill: 05/09/2019 #60 and 0RF UDS: 11/18/2018 Low risk
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
## 2019-07-14 ENCOUNTER — Telehealth: Payer: Self-pay | Admitting: Internal Medicine

## 2019-07-14 DIAGNOSIS — F988 Other specified behavioral and emotional disorders with onset usually occurring in childhood and adolescence: Secondary | ICD-10-CM

## 2019-07-14 MED ORDER — AMPHETAMINE-DEXTROAMPHETAMINE 30 MG PO TABS: 30.0000 mg | ORAL_TABLET | Freq: Two times a day (BID) | ORAL | 0 refills | Status: DC | PRN

## 2019-07-14 NOTE — Telephone Encounter (Signed)
Sent!

## 2019-07-14 NOTE — Telephone Encounter (Signed)
Medication Refill - Medication:  amphetamine-dextroamphetamine (ADDERALL) 30 MG tablet  Has the patient contacted their pharmacy? Yes advised to call and appointment made.   Preferred Pharmacy (with phone number or street name):  [id number] - [place], [address] AT [address] [phone] (Phone) [phone] (Fax)     Agent: Please be advised that RX refills may take up to 3 business days. We ask that you follow-up with your pharmacy.

## 2019-07-14 NOTE — Telephone Encounter (Signed)
Please advise [name] patient

## 2019-07-14 NOTE — Telephone Encounter (Signed)
Adderall refill.   Last OV: 11/18/2018, appt scheduled 08/13/2019 Last Fill: 06/12/2019 #60 and 0RF UDS: 11/18/2018 Low risk

## 2019-07-25 ENCOUNTER — Telehealth: Payer: PRIVATE HEALTH INSURANCE | Admitting: Physician Assistant

## 2019-07-25 ENCOUNTER — Other Ambulatory Visit: Payer: Self-pay

## 2019-07-25 DIAGNOSIS — Z20822 Contact with and (suspected) exposure to covid-19: Secondary | ICD-10-CM

## 2019-07-25 DIAGNOSIS — Z20828 Contact with and (suspected) exposure to other viral communicable diseases: Secondary | ICD-10-CM

## 2019-07-25 MED ORDER — BENZONATATE 100 MG PO CAPS: 100.0000 mg | ORAL_CAPSULE | Freq: Three times a day (TID) | ORAL | 0 refills | Status: DC | PRN

## 2019-07-25 NOTE — Progress Notes (Signed)
E-Visit for Corona Virus Screening   Your current symptoms could be consistent with the coronavirus.  Many health care providers can now test patients at their office but not all are.  [facility] has multiple testing sites. For information on our COVID testing locations and hours go to [email or web address]  Please quarantine yourself while awaiting your test results.  We are enrolling you in our MyChart Home Montioring for COVID19 . Daily you will receive a questionnaire within the MyChart website. Our COVID 19 response team willl be monitoriing your responses daily.    COVID-19 is a respiratory illness with symptoms that are similar to the flu. Symptoms are typically mild to moderate, but there have been cases of severe illness and death due to the virus. The following symptoms may appear 2-14 days after exposure: . Fever . Cough . Shortness of breath or difficulty breathing . Chills . Repeated shaking with chills . Muscle pain . Headache . Sore throat . New loss of taste or smell . Fatigue . Congestion or runny nose . Nausea or vomiting . Diarrhea  It is vitally important that if you feel that you have an infection such as this virus or any other virus that you stay home and away from places where you may spread it to others.  You should self-quarantine for 14 days if you have symptoms that could potentially be coronavirus or have been in close contact a with a person diagnosed with COVID-19 within the last 2 weeks. You should avoid contact with people age 65 and older.   You should wear a mask or cloth face covering over your nose and mouth if you must be around other people or animals, including pets (even at home). Try to stay at least 6 feet away from other people. This will protect the people around you.  You can use medication such as A prescription cough medication called Tessalon Perles 100 mg. You may take 1-2 capsules every 8 hours as needed for  cough  You may also take acetaminophen (Tylenol) as needed for fever.   Reduce your risk of any infection by using the same precautions used for avoiding the common cold or flu:  . Wash your hands often with soap and warm water for at least 20 seconds.  If soap and water are not readily available, use an alcohol-based hand sanitizer with at least 60% alcohol.  . If coughing or sneezing, cover your mouth and nose by coughing or sneezing into the elbow areas of your shirt or coat, into a tissue or into your sleeve (not your hands). . Avoid shaking hands with others and consider head nods or verbal greetings only. . Avoid touching your eyes, nose, or mouth with unwashed hands.  . Avoid close contact with people who are sick. . Avoid places or events with large numbers of people in one location, like concerts or sporting events. . Carefully consider travel plans you have or are making. . If you are planning any travel outside or inside the [place], visit the CDC's Travelers' Health webpage for the latest health notices. . If you have some symptoms but not all symptoms, continue to monitor at home and seek medical attention if your symptoms worsen. . If you are having a medical emergency, call 911.  HOME CARE . Only take medications as instructed by your medical team. . Drink plenty of fluids and get plenty of rest. . A steam or ultrasonic humidifier can help if you have congestion.     GET HELP RIGHT AWAY IF YOU HAVE EMERGENCY WARNING SIGNS** FOR COVID-19. If you or someone is showing any of these signs seek emergency medical care immediately. Call 911 or proceed to your closest emergency facility if: . You develop worsening high fever. . Trouble breathing . Bluish lips or face . Persistent pain or pressure in the chest . New confusion . Inability to wake or stay awake . You cough up blood. . Your symptoms become more severe  **This list is not all possible symptoms. Contact your medical  provider for any symptoms that are sever or concerning to you.   MAKE SURE YOU   Understand these instructions.  Will watch your condition.  Will get help right away if you are not doing well or get worse.  Your e-visit answers were reviewed by a board certified advanced clinical practitioner to complete your personal care plan.  Depending on the condition, your plan could have included both over the counter or prescription medications.  If there is a problem please reply once you have received a response from your provider.  Your safety is important to us.  If you have drug allergies check your prescription carefully.    You can use MyChart to ask questions about today's visit, request a non-urgent call back, or ask for a work or school excuse for 24 hours related to this e-Visit. If it has been greater than 24 hours you will need to follow up with your provider, or enter a new e-Visit to address those concerns. You will get an e-mail in the next two days asking about your experience.  I hope that your e-visit has been valuable and will speed your recovery. Thank you for using e-visits.   Greater than 5 minutes, yet less than 10 minutes of time have been spent researching, coordinating, and implementing care for this patient today  

## 2019-07-26 LAB — NOVEL CORONAVIRUS, NAA: SARS-CoV-2, NAA: NOT DETECTED

## 2019-07-28 ENCOUNTER — Ambulatory Visit (INDEPENDENT_AMBULATORY_CARE_PROVIDER_SITE_OTHER): Payer: PRIVATE HEALTH INSURANCE | Admitting: Internal Medicine

## 2019-07-28 ENCOUNTER — Other Ambulatory Visit: Payer: Self-pay

## 2019-07-28 DIAGNOSIS — J069 Acute upper respiratory infection, unspecified: Secondary | ICD-10-CM | POA: Diagnosis not present

## 2019-07-28 MED ORDER — AMOXICILLIN 875 MG PO TABS: 875.0000 mg | ORAL_TABLET | Freq: Two times a day (BID) | ORAL | 0 refills | Status: DC

## 2019-07-28 NOTE — Progress Notes (Signed)
Subjective:    Patient ID: Travis Dunn, male    DOB: 1980/12/08, 38 y.o.   MRN: [id number]  DOS:  07/28/2019 Type of visit - description: Attempted  to make this a video visit, due to technical difficulties from the patient side it was not possible  thus we proceeded with a Virtual Visit via Telephone    I connected with@   by telephone and verified that I am speaking with the correct person using two identifiers.  THIS ENCOUNTER IS A VIRTUAL VISIT DUE TO COVID-19 - PATIENT WAS NOT SEEN IN THE OFFICE. PATIENT HAS CONSENTED TO VIRTUAL VISIT / TELEMEDICINE VISIT   Location of patient: home  Location of provider: office  I discussed the limitations, risks, security and privacy concerns of performing an evaluation and management service by telephone and the availability of in person appointments. I also discussed with the patient that there may be a patient responsible charge related to this service. The patient expressed understanding and agreed to proceed.   History of Present Illness: Acute Symptoms started 6 days ago with hoarseness and mild sore throat. Got tested for Covid 2 days ago, test came back negative. He is calling because he is feeling about the same, continue with hoarseness and mild sore throat. Also mild cough with minimal sputum production.  Review of Systems Denies fever chills No nausea or vomiting Normal smell and taste Energy slightly low. No myalgias  Past Medical History:  Diagnosis Date  . ADD (attention deficit disorder)     Past Surgical History:  Procedure Laterality Date  . APPENDECTOMY     has abscess    Social History   Socioeconomic History  . Marital status: Divorced    Spouse name: Not on file  . Number of children: 1  . Years of education: Not on file  . Highest education level: Not on file  Occupational History  . Occupation: Project Manager for [employer]  . Financial resource strain: Not on file  .  Food insecurity    Worry: Not on file    Inability: Not on file  . Transportation needs    Medical: Not on file    Non-medical: Not on file  Tobacco Use  . Smoking status: Never Smoker  . Smokeless tobacco: Never Used  Substance and Sexual Activity  . Alcohol use: Yes    Comment: social  . Drug use: No  . Sexual activity: Yes  Lifestyle  . Physical activity    Days per week: Not on file    Minutes per session: Not on file  . Stress: Not on file  Relationships  . Social Herbalist on phone: Not on file    Gets together: Not on file    Attends religious service: Not on file    Active member of club or organization: Not on file    Attends meetings of clubs or organizations: Not on file    Relationship status: Not on file  . Intimate partner violence    Fear of current or ex partner: Not on file    Emotionally abused: Not on file    Physically abused: Not on file    Forced sexual activity: Not on file  Other Topics Concern  . Not on file  Social History Narrative   Divorced,  Son 2012      Allergies as of 07/28/2019   No Known Allergies     Medication List  Accurate as of July 28, 2019  8:56 AM. If you have any questions, ask your nurse or doctor.        amphetamine-dextroamphetamine 30 MG tablet Commonly known as: Adderall Take 1 tablet by mouth 2 (two) times daily as needed.   benzonatate 100 MG capsule Commonly known as: Tessalon Perles Take 1 capsule (100 mg total) by mouth 3 (three) times daily as needed for cough (cough).           Objective:   Physical Exam There were no vitals taken for this visit. We attempted a video virtual visit, I saw him briefly and the connection stopped.  We continue via telephone.  He looked and sounded well, at baseline.  No distress    Assessment      Assessment ADD Increased LFTs: Hep C and B serology (-) 10-2015. 12-2016: wnl a-1-antitrypsin/ceruloplasmine  PLAN: URI  Upper respiratory symptoms  for 6 days, very few symptoms to support COVID-19, in addition he had a negative testing. Sore throat and hoarseness are his main symptoms. Plan: err on the side of caution and prescribed amoxicillin (?  Strep infection), rest, fluids, Mucinex DM, call if not gradually better       I discussed the assessment and treatment plan with the patient. The patient was provided an opportunity to ask questions and all were answered. The patient agreed with the plan and demonstrated an understanding of the instructions.   The patient was advised to call back or seek an in-person evaluation if the symptoms worsen or if the condition fails to improve as anticipated.  I provided 16 minutes of non-face-to-face time during this encounter.  Willow Ora, MD

## 2019-07-29 NOTE — Assessment & Plan Note (Signed)
URI  Upper respiratory symptoms for 6 days, very few symptoms to support COVID-19, in addition he had a negative testing. Sore throat and hoarseness are his main symptoms. Plan: err on the side of caution and prescribed amoxicillin (?  Strep infection), rest, fluids, Mucinex DM, call if not gradually better

## 2019-08-12 ENCOUNTER — Other Ambulatory Visit: Payer: Self-pay

## 2019-08-13 ENCOUNTER — Other Ambulatory Visit: Payer: Self-pay

## 2019-08-13 ENCOUNTER — Ambulatory Visit (INDEPENDENT_AMBULATORY_CARE_PROVIDER_SITE_OTHER): Payer: PRIVATE HEALTH INSURANCE | Admitting: Internal Medicine

## 2019-08-13 ENCOUNTER — Encounter: Payer: Self-pay | Admitting: Internal Medicine

## 2019-08-13 VITALS — BP 135/75 | HR 84 | Temp 97.6°F | Resp 16 | Ht 70.0 in | Wt 221.1 lb

## 2019-08-13 DIAGNOSIS — Z0000 Encounter for general adult medical examination without abnormal findings: Secondary | ICD-10-CM

## 2019-08-13 DIAGNOSIS — F988 Other specified behavioral and emotional disorders with onset usually occurring in childhood and adolescence: Secondary | ICD-10-CM

## 2019-08-13 DIAGNOSIS — E785 Hyperlipidemia, unspecified: Secondary | ICD-10-CM

## 2019-08-13 MED ORDER — AMPHETAMINE-DEXTROAMPHETAMINE 30 MG PO TABS: 30.0000 mg | ORAL_TABLET | Freq: Two times a day (BID) | ORAL | 0 refills | Status: DC | PRN

## 2019-08-13 NOTE — Progress Notes (Signed)
Subjective:    Patient ID: Travis Dunn, male    DOB: 1980-12-31, 38 y.o.   MRN: [id number]  DOS:  08/13/2019 Type of visit - description: CPX No major concerns Reports some stress related to the quarantine but in general doing well.   Review of Systems   Other than above, a 14 point review of systems is negative    Past Medical History:  Diagnosis Date  . ADD (attention deficit disorder)     Past Surgical History:  Procedure Laterality Date  . APPENDECTOMY     has abscess    Social History   Socioeconomic History  . Marital status: Divorced    Spouse name: Not on file  . Number of children: 1  . Years of education: Not on file  . Highest education level: Not on file  Occupational History  . Occupation: Radio producer company  Social Needs  . Financial resource strain: Not on file  . Food insecurity    Worry: Not on file    Inability: Not on file  . Transportation needs    Medical: Not on file    Non-medical: Not on file  Tobacco Use  . Smoking status: Never Smoker  . Smokeless tobacco: Never Used  Substance and Sexual Activity  . Alcohol use: Yes    Comment: almost qd , 2-4 beers qhs   . Drug use: No  . Sexual activity: Yes  Lifestyle  . Physical activity    Days per week: Not on file    Minutes per session: Not on file  . Stress: Not on file  Relationships  . Social Musician on phone: Not on file    Gets together: Not on file    Attends religious service: Not on file    Active member of club or organization: Not on file    Attends meetings of clubs or organizations: Not on file    Relationship status: Not on file  . Intimate partner violence    Fear of current or ex partner: Not on file    Emotionally abused: Not on file    Physically abused: Not on file    Forced sexual activity: Not on file  Other Topics Concern  . Not on file  Social History Narrative   Divorced,  Son 2012     Family History  Problem  Relation Age of Onset  . Diabetes Father   . Colon cancer Maternal Grandmother   . CAD Maternal Grandfather        MI age 11s  . Prostate cancer Neg Hx      Allergies as of 08/13/2019   No Known Allergies     Medication List       Accurate as of August 13, 2019 11:59 PM. If you have any questions, ask your nurse or doctor.        STOP taking these medications   amoxicillin 875 MG tablet Commonly known as: AMOXIL Stopped by: Willow Ora, MD   benzonatate 100 MG capsule Commonly known as: Lawyer Stopped by: Willow Ora, MD     TAKE these medications   amphetamine-dextroamphetamine 30 MG tablet Commonly known as: Adderall Take 1 tablet by mouth 2 (two) times daily as needed.           Objective:   Physical Exam BP 135/75 (BP Location: Left Arm, Patient Position: Sitting, Cuff Size: Normal)   Pulse 84   Temp 97.6 F (36.4  C) (Temporal)   Resp 16   Ht 5\' 10"  (1.778 m)   Wt 221 lb 2 oz (100.3 kg)   SpO2 100%   BMI 31.73 kg/m  General: Well developed, NAD, BMI noted Neck: No  thyromegaly  HEENT:  Normocephalic . Face symmetric, atraumatic Lungs:  CTA B Normal respiratory effort, no intercostal retractions, no accessory muscle use. Heart: RRR,  no murmur.  No pretibial edema bilaterally  Abdomen:  Not distended, soft, non-tender. No rebound or rigidity.   Skin: Exposed areas without rash. Not pale. Not jaundice Neurologic:  alert & oriented X3.  Speech normal, gait appropriate for age and unassisted Strength symmetric and appropriate for age.  Psych: Cognition and judgment appear intact.  Cooperative with normal attention span and concentration.  Behavior appropriate. No anxious or depressed appearing.     Assessment    Assessment High cholesterol ADD Increased LFTs: Hep C and B serology (-) 10-2015. 12-2016: wnl a-1-antitrypsin/ceruloplasmine  PLAN: Here for CPX High cholesterol: Last total cholesterol 2019 was near 300, explained  patient that was high.  We will recheck it today, he is not fasting.  If it continues to be elevated at some point will consider medication. ADD: Controlled, refill sent History of increased LFTs: See social history, he drinks almost every night, 3-4 beers.  Advised patient for males is unhealthy to drink more than 2 servings per day and that might explain his previously increased LFTs. RTC 6 months

## 2019-08-13 NOTE — Patient Instructions (Signed)
GO TO THE LAB : Get the blood work     GO TO THE FRONT DESK Schedule your next appointment for a   checkup in 6 months 

## 2019-08-13 NOTE — Progress Notes (Signed)
Pre visit review using our clinic review tool, if applicable. No additional management support is needed unless otherwise documented below in the visit note. 

## 2019-08-14 ENCOUNTER — Encounter: Payer: Self-pay | Admitting: Internal Medicine

## 2019-08-14 ENCOUNTER — Telehealth: Payer: Self-pay

## 2019-08-14 LAB — CBC WITH DIFFERENTIAL/PLATELET
Basophils Absolute: 0 10*3/uL (ref 0.0–0.1)
Basophils Relative: 0.3 % (ref 0.0–3.0)
Eosinophils Absolute: 0.2 10*3/uL (ref 0.0–0.7)
Eosinophils Relative: 3 % (ref 0.0–5.0)
HCT: 45.9 % (ref 39.0–52.0)
Hemoglobin: 15.4 g/dL (ref 13.0–17.0)
Lymphocytes Relative: 24.9 % (ref 12.0–46.0)
Lymphs Abs: 2 10*3/uL (ref 0.7–4.0)
MCHC: 33.5 g/dL (ref 30.0–36.0)
MCV: 95.2 fl (ref 78.0–100.0)
Monocytes Absolute: 0.8 10*3/uL (ref 0.1–1.0)
Monocytes Relative: 10.3 % (ref 3.0–12.0)
Neutro Abs: 4.9 10*3/uL (ref 1.4–7.7)
Neutrophils Relative %: 61.5 % (ref 43.0–77.0)
Platelets: 301 10*3/uL (ref 150.0–400.0)
RBC: 4.82 Mil/uL (ref 4.22–5.81)
RDW: 13.1 % (ref 11.5–15.5)
WBC: 8 10*3/uL (ref 4.0–10.5)

## 2019-08-14 LAB — LIPID PANEL
Cholesterol: 313 mg/dL — ABNORMAL HIGH (ref 0–200)
HDL: 42.5 mg/dL (ref >39.00)
Total CHOL/HDL Ratio: 7
Triglycerides: 452 mg/dL — ABNORMAL HIGH (ref 0.0–149.0)

## 2019-08-14 LAB — COMPREHENSIVE METABOLIC PANEL
ALT: 51 U/L (ref 0–53)
AST: 22 U/L (ref 0–37)
Albumin: 4.6 g/dL (ref 3.5–5.2)
Alkaline Phosphatase: 83 U/L (ref 39–117)
BUN: 23 mg/dL (ref 6–23)
CO2: 29 mEq/L (ref 19–32)
Calcium: 9.8 mg/dL (ref 8.4–10.5)
Chloride: 102 mEq/L (ref 96–112)
Creatinine, Ser: 1.21 mg/dL (ref 0.40–1.50)
GFR: 66.53 mL/min (ref >60.00)
Glucose, Bld: 76 mg/dL (ref 70–99)
Potassium: 4.4 mEq/L (ref 3.5–5.1)
Sodium: 139 mEq/L (ref 135–145)
Total Bilirubin: 0.5 mg/dL (ref 0.2–1.2)
Total Protein: 7.3 g/dL (ref 6.0–8.3)

## 2019-08-14 LAB — LDL CHOLESTEROL, DIRECT: Direct LDL: 196 mg/dL

## 2019-08-14 LAB — TSH: TSH: 2.45 u[IU]/mL (ref 0.35–4.50)

## 2019-08-14 NOTE — Assessment & Plan Note (Signed)
Td 2015; had a flu shot -CCS: not indicated - Prostate ca screening: no indicated  - Diet , exercise: Discussed, he is active with his son, recommend routine exercise.  Also recommend to incorporate fruits and salads to his diet. -Labs: CMP, FLP, CBC, TSH

## 2019-08-14 NOTE — Assessment & Plan Note (Signed)
Here for CPX High cholesterol: Last total cholesterol 2019 was near 300, explained patient that was high.  We will recheck it today, he is not fasting.  If it continues to be elevated at some point will consider medication. ADD: Controlled, refill sent History of increased LFTs: See social history, he drinks almost every night, 3-4 beers.  Advised patient for males is unhealthy to drink more than 2 servings per day and that might explain his previously increased LFTs. RTC 6 months

## 2019-08-14 NOTE — Telephone Encounter (Signed)
PA approved. Request Reference Number: [id number]. AMPHET/DEXTR TAB 30MG  is approved through 08/13/2020. For further questions, call [phone]

## 2019-08-14 NOTE — Telephone Encounter (Signed)
PA initiated via Covermymeds; KEY: [id number]. Awaiting determination.

## 2019-08-19 MED ORDER — ATORVASTATIN CALCIUM 20 MG PO TABS: 20.0000 mg | ORAL_TABLET | Freq: Every day | ORAL | 2 refills | Status: DC

## 2019-08-19 NOTE — Addendum Note (Signed)
Addended byDamita Dunnings D on: 08/19/2019 08:07 AM   Modules accepted: Orders

## 2019-09-17 ENCOUNTER — Telehealth: Payer: Self-pay | Admitting: Internal Medicine

## 2019-09-17 DIAGNOSIS — F988 Other specified behavioral and emotional disorders with onset usually occurring in childhood and adolescence: Secondary | ICD-10-CM

## 2019-09-17 MED ORDER — AMPHETAMINE-DEXTROAMPHETAMINE 30 MG PO TABS: 30.0000 mg | ORAL_TABLET | Freq: Two times a day (BID) | ORAL | 0 refills | Status: DC | PRN

## 2019-09-17 NOTE — Telephone Encounter (Signed)
Sent x2  

## 2019-09-17 NOTE — Telephone Encounter (Signed)
Adderall refill.   Last OV: 08/13/2019 Last Fill: 08/13/2019 #60 and 0RF Pt sig: 1 tab bid prn UDS: 11/18/2018 Low risk

## 2019-09-23 ENCOUNTER — Encounter: Payer: Self-pay | Admitting: Internal Medicine

## 2019-10-10 ENCOUNTER — Other Ambulatory Visit (INDEPENDENT_AMBULATORY_CARE_PROVIDER_SITE_OTHER): Payer: PRIVATE HEALTH INSURANCE

## 2019-10-10 ENCOUNTER — Other Ambulatory Visit: Payer: Self-pay

## 2019-10-10 DIAGNOSIS — E785 Hyperlipidemia, unspecified: Secondary | ICD-10-CM

## 2019-10-10 LAB — LIPID PANEL
Cholesterol: 164 mg/dL (ref 0–200)
HDL: 35.2 mg/dL — ABNORMAL LOW (ref >39.00)
NonHDL: 128.96
Total CHOL/HDL Ratio: 5
Triglycerides: 217 mg/dL — ABNORMAL HIGH (ref 0.0–149.0)
VLDL: 43.4 mg/dL — ABNORMAL HIGH (ref 0.0–40.0)

## 2019-10-10 LAB — LDL CHOLESTEROL, DIRECT: Direct LDL: 101 mg/dL

## 2019-10-10 LAB — ALT: ALT: 50 U/L (ref 0–53)

## 2019-10-10 LAB — AST: AST: 21 U/L (ref 0–37)

## 2019-10-14 MED ORDER — ATORVASTATIN CALCIUM 20 MG PO TABS: 20.0000 mg | ORAL_TABLET | Freq: Every day | ORAL | 3 refills | Status: DC

## 2019-10-14 NOTE — Addendum Note (Signed)
Addended by: Damita Dunnings D on: 10/14/2019 10:10 AM   Modules accepted: Orders

## 2019-10-20 ENCOUNTER — Other Ambulatory Visit: Payer: Self-pay | Admitting: Internal Medicine

## 2019-10-20 DIAGNOSIS — F988 Other specified behavioral and emotional disorders with onset usually occurring in childhood and adolescence: Secondary | ICD-10-CM

## 2019-10-21 MED ORDER — AMPHETAMINE-DEXTROAMPHETAMINE 30 MG PO TABS: 30.0000 mg | ORAL_TABLET | Freq: Two times a day (BID) | ORAL | 0 refills | Status: DC | PRN

## 2019-10-21 NOTE — Telephone Encounter (Signed)
Rx sent x2

## 2019-11-21 ENCOUNTER — Telehealth: Payer: Self-pay | Admitting: Internal Medicine

## 2019-11-21 DIAGNOSIS — F988 Other specified behavioral and emotional disorders with onset usually occurring in childhood and adolescence: Secondary | ICD-10-CM

## 2019-11-21 MED ORDER — AMPHETAMINE-DEXTROAMPHETAMINE 30 MG PO TABS: 30.0000 mg | ORAL_TABLET | Freq: Two times a day (BID) | ORAL | 0 refills | Status: DC | PRN

## 2019-11-21 NOTE — Telephone Encounter (Signed)
PDMP okay, prescription sent x 2 

## 2019-11-21 NOTE — Telephone Encounter (Signed)
Adderall refill.   Last OV: 08/13/2019 Last Fill: 10/21/2019 #60 and 0RF Pt sig 1 tab bid prn UDS: 11/18/2018 Low risk

## 2019-11-24 ENCOUNTER — Encounter: Payer: Self-pay | Admitting: Internal Medicine

## 2019-11-24 ENCOUNTER — Ambulatory Visit (INDEPENDENT_AMBULATORY_CARE_PROVIDER_SITE_OTHER): Payer: PRIVATE HEALTH INSURANCE | Admitting: Internal Medicine

## 2019-11-24 DIAGNOSIS — M5416 Radiculopathy, lumbar region: Secondary | ICD-10-CM

## 2019-11-24 MED ORDER — PREDNISONE 10 MG PO TABS: ORAL_TABLET | ORAL | 0 refills | Status: DC

## 2019-11-24 NOTE — Progress Notes (Signed)
   Subjective:    Patient ID: Travis Dunn, male    DOB: December 05, 1980, 39 y.o.   MRN: [id number]  DOS:  11/24/2019 Type of visit - description: Virtual Visit via Video Note  I connected with the above patient  by a video enabled telemedicine application and verified that I am speaking with the correct person using two identifiers.   THIS ENCOUNTER IS A VIRTUAL VISIT DUE TO COVID-19 - PATIENT WAS NOT SEEN IN THE OFFICE. PATIENT HAS CONSENTED TO VIRTUAL VISIT / TELEMEDICINE VISIT   Location of patient: home  Location of provider: office  I discussed the limitations of evaluation and management by telemedicine and the availability of in person appointments. The patient expressed understanding and agreed to proceed.  Acute The last several weeks, he has been working at home installing new floors and doing some lifting. A week ago, developed a pain around the left hip, numbness of the left leg mostly anteriorly all the way to the foot. The pain is a steady but increases with certain movements.   Review of Systems When asked, admits that sometimes the left leg feels somewhat weak and his knee buckles.  No apparent foot drop, no bladder or bowel incontinence No saddle numbness.   Past Medical History:  Diagnosis Date  . ADD (attention deficit disorder)     Past Surgical History:  Procedure Laterality Date  . APPENDECTOMY     has abscess        Objective:   Physical Exam There were no vitals taken for this visit. This is a virtual video visit, he is alert oriented x3, in no distress.    Assessment     Assessment High cholesterol ADD Increased LFTs: Hep C and B serology (-) 10-2015. 12-2016: wnl a-1-antitrypsin/ceruloplasmine  PLAN: Lumbar radiculopathy The patient knows the limitations of virtual visit but his symptoms are consistent with left radiculopathy. Plan: Refer to Ortho Prednisone Tylenol for pain control Call if not better.    I discussed the assessment and  treatment plan with the patient. The patient was provided an opportunity to ask questions and all were answered. The patient agreed with the plan and demonstrated an understanding of the instructions.   The patient was advised to call back or seek an in-person evaluation if the symptoms worsen or if the condition fails to improve as anticipated.

## 2019-11-25 NOTE — Assessment & Plan Note (Signed)
Lumbar radiculopathy The patient knows the limitations of virtual visit but his symptoms are consistent with left radiculopathy. Plan: Refer to Ortho Prednisone Tylenol for pain control Call if not better.

## 2019-12-22 ENCOUNTER — Telehealth: Payer: Self-pay | Admitting: Internal Medicine

## 2019-12-22 DIAGNOSIS — F988 Other specified behavioral and emotional disorders with onset usually occurring in childhood and adolescence: Secondary | ICD-10-CM

## 2019-12-22 MED ORDER — AMPHETAMINE-DEXTROAMPHETAMINE 30 MG PO TABS: 30.0000 mg | ORAL_TABLET | Freq: Two times a day (BID) | ORAL | 0 refills | Status: DC | PRN

## 2019-12-22 NOTE — Telephone Encounter (Signed)
Adderall refill.   Last OV: 11/24/2019 Last Fill: 11/21/2019 #60 and 0RF Pt sig: 1 tab bid prn UDS: 11/18/2018 Low risk

## 2019-12-22 NOTE — Telephone Encounter (Signed)
Sent!

## 2020-01-20 ENCOUNTER — Telehealth: Payer: Self-pay | Admitting: Internal Medicine

## 2020-01-20 DIAGNOSIS — F988 Other specified behavioral and emotional disorders with onset usually occurring in childhood and adolescence: Secondary | ICD-10-CM

## 2020-01-20 MED ORDER — AMPHETAMINE-DEXTROAMPHETAMINE 30 MG PO TABS: 30.0000 mg | ORAL_TABLET | Freq: Two times a day (BID) | ORAL | 0 refills | Status: DC | PRN

## 2020-01-20 NOTE — Telephone Encounter (Signed)
PDMP okay, prescription sent x 2 

## 2020-01-20 NOTE — Telephone Encounter (Signed)
Adderall refill.   Last OV: 11/24/2019 Last Fill: 12/22/2019 #60 and 0RF Pt sig: 1 tab bid prn UDS: 11/18/2018 Low risk  Needs UDS at next OV.

## 2020-02-20 ENCOUNTER — Other Ambulatory Visit: Payer: Self-pay | Admitting: Internal Medicine

## 2020-02-20 DIAGNOSIS — F988 Other specified behavioral and emotional disorders with onset usually occurring in childhood and adolescence: Secondary | ICD-10-CM

## 2020-03-23 ENCOUNTER — Telehealth: Payer: Self-pay | Admitting: Internal Medicine

## 2020-03-23 DIAGNOSIS — F988 Other specified behavioral and emotional disorders with onset usually occurring in childhood and adolescence: Secondary | ICD-10-CM

## 2020-03-24 MED ORDER — AMPHETAMINE-DEXTROAMPHETAMINE 30 MG PO TABS: 30.0000 mg | ORAL_TABLET | Freq: Two times a day (BID) | ORAL | 0 refills | Status: DC | PRN

## 2020-03-24 NOTE — Telephone Encounter (Signed)
Adderall refill.   Last OV: 11/24/2019 Last Fill: 01/20/2020 #60 and 0RF Pt sig: 1 tab bid prn UDS: 11/18/2018 Low risk

## 2020-03-24 NOTE — Telephone Encounter (Signed)
PDMP okay, Rx sent x2 

## 2020-05-27 ENCOUNTER — Other Ambulatory Visit: Payer: Self-pay | Admitting: Internal Medicine

## 2020-05-27 DIAGNOSIS — F988 Other specified behavioral and emotional disorders with onset usually occurring in childhood and adolescence: Secondary | ICD-10-CM

## 2020-05-28 MED ORDER — AMPHETAMINE-DEXTROAMPHETAMINE 30 MG PO TABS: 30.0000 mg | ORAL_TABLET | Freq: Two times a day (BID) | ORAL | 0 refills | Status: DC | PRN

## 2020-05-28 NOTE — Telephone Encounter (Signed)
Adderall refill.   Last OV: 11/24/2019 Last Fill: 03/24/2020 #60 and 0RF Pt sig: 1 tab bid prn UDS: 11/18/2018 Low risk  Needs UDS at next OV

## 2020-05-28 NOTE — Telephone Encounter (Signed)
RX sent, please to schedule CPX for October.

## 2020-06-27 ENCOUNTER — Other Ambulatory Visit: Payer: Self-pay | Admitting: Internal Medicine

## 2020-06-27 DIAGNOSIS — F988 Other specified behavioral and emotional disorders with onset usually occurring in childhood and adolescence: Secondary | ICD-10-CM

## 2020-06-29 MED ORDER — AMPHETAMINE-DEXTROAMPHETAMINE 30 MG PO TABS: 30.0000 mg | ORAL_TABLET | Freq: Two times a day (BID) | ORAL | 0 refills | Status: DC | PRN

## 2020-06-29 NOTE — Telephone Encounter (Signed)
Mychart message sent to Pt.

## 2020-06-29 NOTE — Telephone Encounter (Signed)
Prescription sent. Schedule a office visit for October.

## 2020-06-29 NOTE — Telephone Encounter (Signed)
Adderall refill.   Last OV: 11/24/2019, no future appt scheduled Last Fill: 05/28/2020 #60 and 0RF Pt sig: 1 tab bid prn UDS: 11/18/2018 Low risk  Needs UDS at next OV

## 2020-07-02 ENCOUNTER — Telehealth: Payer: Self-pay | Admitting: Internal Medicine

## 2020-07-02 NOTE — Telephone Encounter (Signed)
Patient notified and verbalized understanding. 

## 2020-07-02 NOTE — Telephone Encounter (Signed)
Caller Travis Dunn  Call Back # [phone]  Patient states tested positive with Covid -19 today with a at home test.  Please Advise if patient should he tested again.

## 2020-07-02 NOTE — Telephone Encounter (Signed)
Advise patient: Treat these as the flu or at home with rest, fluids, Tylenol, Mucinex. If possible get a O2 sat monitor. If severe symptoms, high fever, chest pain, difficulty breathing or O2 sat less than 94%: ER. Place at call to the infusion center to see if he qualifies for monoclonal antibodies.

## 2020-07-09 ENCOUNTER — Encounter: Payer: Self-pay | Admitting: Internal Medicine

## 2020-07-26 ENCOUNTER — Other Ambulatory Visit: Payer: Self-pay

## 2020-07-26 ENCOUNTER — Ambulatory Visit (INDEPENDENT_AMBULATORY_CARE_PROVIDER_SITE_OTHER): Payer: PRIVATE HEALTH INSURANCE | Admitting: Internal Medicine

## 2020-07-26 ENCOUNTER — Encounter: Payer: Self-pay | Admitting: Internal Medicine

## 2020-07-26 VITALS — BP 130/86 | HR 81 | Temp 98.1°F | Resp 18 | Ht 70.0 in | Wt 223.0 lb

## 2020-07-26 DIAGNOSIS — E785 Hyperlipidemia, unspecified: Secondary | ICD-10-CM

## 2020-07-26 DIAGNOSIS — Z79899 Other long term (current) drug therapy: Secondary | ICD-10-CM

## 2020-07-26 DIAGNOSIS — Z0000 Encounter for general adult medical examination without abnormal findings: Secondary | ICD-10-CM

## 2020-07-26 DIAGNOSIS — F988 Other specified behavioral and emotional disorders with onset usually occurring in childhood and adolescence: Secondary | ICD-10-CM | POA: Diagnosis not present

## 2020-07-26 MED ORDER — AMPHETAMINE-DEXTROAMPHETAMINE 30 MG PO TABS: 30.0000 mg | ORAL_TABLET | Freq: Two times a day (BID) | ORAL | 0 refills | Status: DC | PRN

## 2020-07-26 NOTE — Progress Notes (Signed)
Pre visit review using our clinic review tool, if applicable. No additional management support is needed unless otherwise documented below in the visit note. 

## 2020-07-26 NOTE — Patient Instructions (Addendum)
Please get your flu shot this season     GO TO THE LAB : Get the blood work     GO TO THE FRONT DESK, PLEASE SCHEDULE YOUR APPOINTMENTS Come back for   a checkup in 6 to 8 months

## 2020-07-26 NOTE — Progress Notes (Signed)
   Subjective:    Patient ID: Travis Dunn, male    DOB: 1981-05-10, 39 y.o.   MRN: [id number]  DOS:  07/26/2020 Type of visit - description: CPX Since the last office visit he is doing well. Did complain of left ear discomfort described as a clogged feeling, no actual pain or discharge. Symptoms worse when he talks. Occasionally has minor allergies but no consistent sinus pain or congestion.  Review of Systems  Other than above, a 14 point review of systems is negative      Past Medical History:  Diagnosis Date  . ADD (attention deficit disorder)     Past Surgical History:  Procedure Laterality Date  . APPENDECTOMY     has abscess    Allergies as of 07/26/2020   No Known Allergies     Medication List       Accurate as of July 26, 2020 11:59 PM. If you have any questions, ask your nurse or doctor.        STOP taking these medications   predniSONE 10 MG tablet Commonly known as: DELTASONE Stopped by: Willow Ora, MD     TAKE these medications   amphetamine-dextroamphetamine 30 MG tablet Commonly known as: Adderall Take 1 tablet by mouth 2 (two) times daily as needed.   atorvastatin 20 MG tablet Commonly known as: LIPITOR Take 1 tablet (20 mg total) by mouth at bedtime.          Objective:   Physical Exam BP 130/86 (BP Location: Left Arm, Patient Position: Sitting, Cuff Size: Normal)   Pulse 81   Temp 98.1 F (36.7 C) (Oral)   Resp 18   Ht 5\' 10"  (1.778 m)   Wt 223 lb (101.2 kg)   SpO2 100%   BMI 32.00 kg/m  General: Well developed, NAD, BMI noted Neck: No  thyromegaly  HEENT:  Normocephalic . Face symmetric, atraumatic. TMs: Bulged bilaterally, no red, discharge Lungs:  CTA B Normal respiratory effort, no intercostal retractions, no accessory muscle use. Heart: RRR,  no murmur.  Abdomen:  Not distended, soft, non-tender. No rebound or rigidity.   Lower extremities: no pretibial edema bilaterally  Skin: Exposed areas without rash. Not  pale. Not jaundice Neurologic:  alert & oriented X3.  Speech normal, gait appropriate for age and unassisted Strength symmetric and appropriate for age.  Psych: Cognition and judgment appear intact.  Cooperative with normal attention span and concentration.  Behavior appropriate. No anxious or depressed appearing.     Assessment    Assessment High cholesterol ADD Increased LFTs: Hep C and B serology (-) 10-2015. 12-2016: wnl a-1-antitrypsin/ceruloplasmine Covid infection 06-2020 (vaccines 02/2020)  PLAN: Here for CPX High cholesterol: On Lipitor, checking labs ADD: UDS today, refill Adderall. Lumbar radiculopathy: See last visit few months ago, DX confirmed by MRI, now better with conservative treatment. ET dysfunction?:  Has a clogged feeling at the left ear, both TMs are bulged, recommend Flonase consistently for 4 to 5 weeks, call if not better. COVID-19: DX 03/2020, he feels fully recuperated. RTC 6 to 77-month    This visit occurred during the SARS-CoV-2 public health emergency.  Safety protocols were in place, including screening questions prior to the visit, additional usage of staff PPE, and extensive cleaning of exam room while observing appropriate contact time as indicated for disinfecting solutions.

## 2020-07-27 ENCOUNTER — Encounter: Payer: Self-pay | Admitting: Internal Medicine

## 2020-07-27 LAB — COMPREHENSIVE METABOLIC PANEL
AG Ratio: 1.4 (calc) (ref 1.0–2.5)
ALT: 82 U/L — ABNORMAL HIGH (ref 9–46)
AST: 31 U/L (ref 10–40)
Albumin: 4.4 g/dL (ref 3.6–5.1)
Alkaline phosphatase (APISO): 89 U/L (ref 36–130)
BUN: 17 mg/dL (ref 7–25)
CO2: 30 mmol/L (ref 20–32)
Calcium: 10.1 mg/dL (ref 8.6–10.3)
Chloride: 101 mmol/L (ref 98–110)
Creat: 1.1 mg/dL (ref 0.60–1.35)
Globulin: 3.1 g/dL (calc) (ref 1.9–3.7)
Glucose, Bld: 76 mg/dL (ref 65–99)
Potassium: 4 mmol/L (ref 3.5–5.3)
Sodium: 138 mmol/L (ref 135–146)
Total Bilirubin: 0.8 mg/dL (ref 0.2–1.2)
Total Protein: 7.5 g/dL (ref 6.1–8.1)

## 2020-07-27 LAB — CBC WITH DIFFERENTIAL/PLATELET
Absolute Monocytes: 591 cells/uL (ref 200–950)
Basophils Absolute: 73 cells/uL (ref 0–200)
Basophils Relative: 1 %
Eosinophils Absolute: 277 cells/uL (ref 15–500)
Eosinophils Relative: 3.8 %
HCT: 44 % (ref 38.5–50.0)
Hemoglobin: 15.4 g/dL (ref 13.2–17.1)
Lymphs Abs: 2219 cells/uL (ref 850–3900)
MCH: 32.1 pg (ref 27.0–33.0)
MCHC: 35 g/dL (ref 32.0–36.0)
MCV: 91.7 fL (ref 80.0–100.0)
MPV: 9.4 fL (ref 7.5–12.5)
Monocytes Relative: 8.1 %
Neutro Abs: 4139 cells/uL (ref 1500–7800)
Neutrophils Relative %: 56.7 %
Platelets: 288 10*3/uL (ref 140–400)
RBC: 4.8 10*6/uL (ref 4.20–5.80)
RDW: 12.3 % (ref 11.0–15.0)
Total Lymphocyte: 30.4 %
WBC: 7.3 10*3/uL (ref 3.8–10.8)

## 2020-07-27 LAB — LIPID PANEL
Cholesterol: 182 mg/dL (ref <200)
HDL: 44 mg/dL (ref >=40)
LDL Cholesterol (Calc): 115 mg/dL (calc) — ABNORMAL HIGH
Non-HDL Cholesterol (Calc): 138 mg/dL (calc) — ABNORMAL HIGH (ref <130)
Total CHOL/HDL Ratio: 4.1 (calc) (ref <5.0)
Triglycerides: 122 mg/dL (ref <150)

## 2020-07-27 NOTE — Assessment & Plan Note (Signed)
Here for CPX High cholesterol: On Lipitor, checking labs ADD: UDS today, refill Adderall. Lumbar radiculopathy: See last visit few months ago, DX confirmed by MRI, now better with conservative treatment. ET dysfunction?:  Has a clogged feeling at the left ear, both TMs are bulged, recommend Flonase consistently for 4 to 5 weeks, call if not better. COVID-19: DX [id number], he feels fully recuperated. RTC 6 to 24-month

## 2020-07-27 NOTE — Assessment & Plan Note (Signed)
Td 2015 -Covid vaccine 02/2020 - flu shot: Plans to get it at work -CCS: not indicated - Prostate ca screening: no indicated - Diet , exercise: Counseled. -EtOH: Has 2 beers daily, advised not to go over 2 drinks qd - labs CMP, FLP, CBC,

## 2020-07-30 LAB — DRUG MONITORING, PANEL 8 WITH CONFIRMATION, URINE
6 Acetylmorphine: NEGATIVE ng/mL (ref <10)
Alcohol Metabolites: POSITIVE ng/mL — AB
Amphetamine: 10434 ng/mL — ABNORMAL HIGH (ref <250)
Amphetamines: POSITIVE ng/mL — AB (ref <500)
Benzodiazepines: NEGATIVE ng/mL (ref <100)
Buprenorphine, Urine: NEGATIVE ng/mL (ref <5)
Cocaine Metabolite: NEGATIVE ng/mL (ref <150)
Creatinine: 144.2 mg/dL
Ethyl Glucuronide (ETG): 2592 ng/mL — ABNORMAL HIGH (ref <500)
Ethyl Sulfate (ETS): 569 ng/mL — ABNORMAL HIGH (ref <100)
MDMA: NEGATIVE ng/mL (ref <500)
Marijuana Metabolite: NEGATIVE ng/mL (ref <20)
Methamphetamine: NEGATIVE ng/mL (ref <250)
Opiates: NEGATIVE ng/mL (ref <100)
Oxidant: NEGATIVE ug/mL
Oxycodone: NEGATIVE ng/mL (ref <100)
pH: 5.5 (ref 4.5–9.0)

## 2020-07-30 LAB — DM TEMPLATE

## 2020-09-03 ENCOUNTER — Other Ambulatory Visit: Payer: Self-pay | Admitting: Internal Medicine

## 2020-09-03 DIAGNOSIS — F988 Other specified behavioral and emotional disorders with onset usually occurring in childhood and adolescence: Secondary | ICD-10-CM

## 2020-09-03 MED ORDER — AMPHETAMINE-DEXTROAMPHETAMINE 30 MG PO TABS: 30.0000 mg | ORAL_TABLET | Freq: Two times a day (BID) | ORAL | 0 refills | Status: DC | PRN

## 2020-09-03 NOTE — Telephone Encounter (Signed)
Requesting: Adderall 30mg  Contract: 11/18/2018 UDS: 07/26/2020 Last Visit: 07/26/2020 Next Visit: None scheduled  Last Refill: 07/26/2020 #60 and 0RF  Please Advise

## 2020-09-07 ENCOUNTER — Encounter: Payer: Self-pay | Admitting: Internal Medicine

## 2020-09-08 ENCOUNTER — Telehealth: Payer: Self-pay

## 2020-09-08 NOTE — Telephone Encounter (Signed)
PA approved.    Request Reference Number: [id number]. AMPHET/DEXTR TAB 30MG  is approved through 09/08/2021. Your patient may now fill this prescription and it will be covered

## 2020-09-08 NOTE — Telephone Encounter (Signed)
PA initiated via Covermymeds; KEY: [id number]. Awaiting determination.

## 2020-10-07 ENCOUNTER — Other Ambulatory Visit: Payer: Self-pay | Admitting: Family Medicine

## 2020-10-07 DIAGNOSIS — F988 Other specified behavioral and emotional disorders with onset usually occurring in childhood and adolescence: Secondary | ICD-10-CM

## 2020-10-08 MED ORDER — AMPHETAMINE-DEXTROAMPHETAMINE 30 MG PO TABS: 30.0000 mg | ORAL_TABLET | Freq: Two times a day (BID) | ORAL | 0 refills | Status: DC | PRN

## 2020-10-08 NOTE — Telephone Encounter (Signed)
Requesting: Adderall 30mg  Contract: 11/18/2018 UDS: 07/26/2020 Last Visit: 07/26/2020 Next Visit: None Last Refill: 09/03/2020 #60 and 0RF  Will you refill in PCP absence?    Please Advise

## 2020-10-29 ENCOUNTER — Other Ambulatory Visit: Payer: Self-pay | Admitting: Internal Medicine

## 2020-11-08 ENCOUNTER — Telehealth: Payer: Self-pay

## 2020-11-08 DIAGNOSIS — F988 Other specified behavioral and emotional disorders with onset usually occurring in childhood and adolescence: Secondary | ICD-10-CM

## 2020-11-08 NOTE — Telephone Encounter (Signed)
Requesting: Adderall 30mg  Contract: 11/18/2018 UDS: 07/26/2020 Last Visit: 07/26/2020 Next Visit: None Last Refill: 10/08/2020 #60 and 0RF  Please Advise

## 2020-11-09 MED ORDER — AMPHETAMINE-DEXTROAMPHETAMINE 30 MG PO TABS: 30.0000 mg | ORAL_TABLET | Freq: Two times a day (BID) | ORAL | 0 refills | Status: DC | PRN

## 2020-11-09 NOTE — Telephone Encounter (Signed)
PDMP okay, 2 prescriptions sent

## 2020-11-09 NOTE — Telephone Encounter (Signed)
Requesting: Adderall 30 mg Contract: 03/01/2018 UDS: 10/4//21 Last Visit: 07/26/2020 Next Visit: none Last Refill: 10/08/2020  Please Advise

## 2020-12-14 ENCOUNTER — Telehealth: Payer: Self-pay | Admitting: Internal Medicine

## 2020-12-14 DIAGNOSIS — F988 Other specified behavioral and emotional disorders with onset usually occurring in childhood and adolescence: Secondary | ICD-10-CM

## 2020-12-14 MED ORDER — AMPHETAMINE-DEXTROAMPHETAMINE 30 MG PO TABS: 30.0000 mg | ORAL_TABLET | Freq: Two times a day (BID) | ORAL | 0 refills | Status: DC | PRN

## 2020-12-14 NOTE — Telephone Encounter (Signed)
PDMP okay, Rx sent 

## 2020-12-14 NOTE — Telephone Encounter (Signed)
Requesting: Adderall 30mg  Contract: 11/18/2018 UDS: 07/26/2020 Last Visit: 07/26/2020 Next Visit: None Last Refill: 11/09/2020 #60 and 0RF  Please Advise

## 2021-01-06 ENCOUNTER — Encounter: Payer: Self-pay | Admitting: Internal Medicine

## 2021-01-12 ENCOUNTER — Other Ambulatory Visit: Payer: Self-pay | Admitting: Internal Medicine

## 2021-01-12 DIAGNOSIS — F988 Other specified behavioral and emotional disorders with onset usually occurring in childhood and adolescence: Secondary | ICD-10-CM

## 2021-03-15 ENCOUNTER — Telehealth: Payer: Self-pay | Admitting: Internal Medicine

## 2021-03-15 DIAGNOSIS — F988 Other specified behavioral and emotional disorders with onset usually occurring in childhood and adolescence: Secondary | ICD-10-CM

## 2021-03-15 NOTE — Telephone Encounter (Signed)
Requesting: Adderall 30mg  Contract:11/18/2018 UDS:07/26/2020 Last Visit: 07/26/2020 Next Visit: None Last Refill: 12/14/2020 #60 and 0RF  Please Advise

## 2021-03-16 MED ORDER — AMPHETAMINE-DEXTROAMPHETAMINE 30 MG PO TABS: 30.0000 mg | ORAL_TABLET | Freq: Two times a day (BID) | ORAL | 0 refills | Status: DC | PRN

## 2021-03-16 NOTE — Telephone Encounter (Signed)
PDMP okay, Rx sent x2 

## 2021-04-17 ENCOUNTER — Other Ambulatory Visit: Payer: Self-pay | Admitting: Internal Medicine

## 2021-04-17 DIAGNOSIS — F988 Other specified behavioral and emotional disorders with onset usually occurring in childhood and adolescence: Secondary | ICD-10-CM

## 2021-05-12 ENCOUNTER — Other Ambulatory Visit: Payer: Self-pay | Admitting: Internal Medicine

## 2021-05-22 ENCOUNTER — Telehealth: Payer: Self-pay | Admitting: Internal Medicine

## 2021-05-22 DIAGNOSIS — F988 Other specified behavioral and emotional disorders with onset usually occurring in childhood and adolescence: Secondary | ICD-10-CM

## 2021-05-23 MED ORDER — AMPHETAMINE-DEXTROAMPHETAMINE 30 MG PO TABS: 30.0000 mg | ORAL_TABLET | Freq: Two times a day (BID) | ORAL | 0 refills | Status: DC | PRN

## 2021-05-23 NOTE — Telephone Encounter (Signed)
PDMP okay, Rx sent x2 

## 2021-05-23 NOTE — Telephone Encounter (Signed)
Requesting:Adderall 30mg  Contract: 11/18/2018 UDS: 07/26/2020 Last Visit: 07/26/2020 Next Visit: None Last Refill: 03/16/2021 #60 and 0RF (x2)  Please Advise

## 2021-06-13 ENCOUNTER — Other Ambulatory Visit: Payer: Self-pay | Admitting: Internal Medicine

## 2021-06-28 ENCOUNTER — Encounter: Payer: Self-pay | Admitting: Internal Medicine

## 2021-06-28 DIAGNOSIS — F988 Other specified behavioral and emotional disorders with onset usually occurring in childhood and adolescence: Secondary | ICD-10-CM

## 2021-06-29 MED ORDER — AMPHETAMINE-DEXTROAMPHETAMINE 30 MG PO TABS: 30.0000 mg | ORAL_TABLET | Freq: Two times a day (BID) | ORAL | 0 refills | Status: DC | PRN

## 2021-06-29 NOTE — Telephone Encounter (Signed)
Prescription sent.  PDMP was okay.

## 2021-06-29 NOTE — Telephone Encounter (Signed)
Pt's normal pharmacy ([facility] on [address]) is out of Adderall, not expected to return to stock until next month. Can you send the prescription to [facility] on [address] please? I have it pended for you.

## 2021-07-01 ENCOUNTER — Other Ambulatory Visit: Payer: Self-pay | Admitting: Internal Medicine

## 2021-07-28 ENCOUNTER — Encounter: Payer: Self-pay | Admitting: Internal Medicine

## 2021-07-28 DIAGNOSIS — F988 Other specified behavioral and emotional disorders with onset usually occurring in childhood and adolescence: Secondary | ICD-10-CM

## 2021-07-29 MED ORDER — AMPHETAMINE-DEXTROAMPHETAMINE 30 MG PO TABS: 30.0000 mg | ORAL_TABLET | Freq: Two times a day (BID) | ORAL | 0 refills | Status: DC | PRN

## 2021-07-29 NOTE — Telephone Encounter (Signed)
PDMP okay, Rx sent 

## 2021-07-29 NOTE — Telephone Encounter (Signed)
Received fax from [facility] on [address] in [place], they do not have Adderall 30mg  in stock (back order)- the [facility] on [address] in [place] does have it and it is okay with Pt for [facility] to send there. I have pended the Rx and set the new pharmacy for you.

## 2021-08-08 ENCOUNTER — Ambulatory Visit (INDEPENDENT_AMBULATORY_CARE_PROVIDER_SITE_OTHER): Payer: No Typology Code available for payment source | Admitting: Internal Medicine

## 2021-08-08 ENCOUNTER — Other Ambulatory Visit: Payer: Self-pay

## 2021-08-08 ENCOUNTER — Encounter: Payer: Self-pay | Admitting: Internal Medicine

## 2021-08-08 VITALS — BP 126/80 | HR 106 | Temp 98.2°F | Resp 16 | Ht 70.0 in | Wt 225.2 lb

## 2021-08-08 DIAGNOSIS — F988 Other specified behavioral and emotional disorders with onset usually occurring in childhood and adolescence: Secondary | ICD-10-CM | POA: Diagnosis not present

## 2021-08-08 DIAGNOSIS — E785 Hyperlipidemia, unspecified: Secondary | ICD-10-CM

## 2021-08-08 DIAGNOSIS — Z0000 Encounter for general adult medical examination without abnormal findings: Secondary | ICD-10-CM

## 2021-08-08 DIAGNOSIS — Z79899 Other long term (current) drug therapy: Secondary | ICD-10-CM | POA: Diagnosis not present

## 2021-08-08 NOTE — Assessment & Plan Note (Signed)
Here for CPX High cholesterol: On Lipitor, labs. ADD: Well-controlled on Adderall, UDS and contract today. Elevated heart rate: Typically in the 80s, today 106.  No symptoms.  Asked to come back in 6 months to recheck RTC 6 months

## 2021-08-08 NOTE — Assessment & Plan Note (Signed)
-  Td 2015 -Covid vaccine: Recommend booster - flu shot: Declined -CCS: not indicated - Prostate ca screening: no indicated  - Diet: Regular, advised to follow a healthy diet.  Advised to exercise 3 hours a week. -EtOH: Again recommend<  2 servings a day. - labs CMP, FLP, CBC, TSH, UDS

## 2021-08-08 NOTE — Progress Notes (Signed)
Subjective:    Patient ID: Travis Dunn, male    DOB: 11-29-1980, 40 y.o.   MRN: [id number]  DOS:  08/08/2021 Type of visit - description: CPX  Since the last office visit is doing well.  Has no concerns.  Has no symptoms.  Review of Systems  A 14 point review of systems is negative     Past Medical History:  Diagnosis Date   ADD (attention deficit disorder)     Past Surgical History:  Procedure Laterality Date   APPENDECTOMY     has abscess   Social History   Socioeconomic History   Marital status: Divorced    Spouse name: Not on file   Number of children: 1   Years of education: Not on file   Highest education level: Not on file  Occupational History   Occupation: Emergency planning/management officer for [employer]  Tobacco Use   Smoking status: Never   Smokeless tobacco: Never  Substance and Sexual Activity   Alcohol use: Yes    Comment: 2 beers qd    Drug use: No   Sexual activity: Yes  Other Topics Concern   Not on file  Social History Narrative   Divorced,  Son 2012   Social Determinants of Health   Financial Resource Strain: Not on file  Food Insecurity: Not on file  Transportation Needs: Not on file  Physical Activity: Not on file  Stress: Not on file  Social Connections: Not on file  Intimate Partner Violence: Not on file    Allergies as of 08/08/2021   No Known Allergies      Medication List        Accurate as of August 08, 2021  9:16 PM. If you have any questions, ask your nurse or doctor.          amphetamine-dextroamphetamine 30 MG tablet Commonly known as: Adderall Take 1 tablet by mouth 2 (two) times daily as needed.   atorvastatin 20 MG tablet Commonly known as: LIPITOR TAKE 1 TABLET(20 MG) BY MOUTH AT BEDTIME           Objective:   Physical Exam BP 126/80 (BP Location: Left Arm, Patient Position: Sitting, Cuff Size: Normal)   Pulse (!) 106   Temp 98.2 F (36.8 C) (Oral)   Resp 16   Ht 5\' 10"  (1.778 m)   Wt 225 lb  4 oz (102.2 kg)   SpO2 98%   BMI 32.32 kg/m  General: Well developed, NAD, BMI noted Neck: No  thyromegaly  HEENT:  Normocephalic . Face symmetric, atraumatic Lungs:  CTA B Normal respiratory effort, no intercostal retractions, no accessory muscle use. Heart: RRR,  no murmur.  Abdomen:  Not distended, soft, non-tender. No rebound or rigidity.   Lower extremities: no pretibial edema bilaterally  Skin: Exposed areas without rash. Not pale. Not jaundice Neurologic:  alert & oriented X3.  Speech normal, gait appropriate for age and unassisted Strength symmetric and appropriate for age.  Psych: Cognition and judgment appear intact.  Cooperative with normal attention span and concentration.  Behavior appropriate. No anxious or depressed appearing.     Assessment      Assessment High cholesterol ADD Increased LFTs: Hep C and B serology (-) 10-2015. 12-2016: wnl a-1-antitrypsin/ceruloplasmine Covid infection 06-2020 (vaccines 02/2020)  PLAN: Here for CPX High cholesterol: On Lipitor, labs. ADD: Well-controlled on Adderall, UDS and contract today. Elevated heart rate: Typically in the 80s, today 106.  No symptoms.  Asked to come back  in 6 months to recheck RTC 6 months    This visit occurred during the SARS-CoV-2 public health emergency.  Safety protocols were in place, including screening questions prior to the visit, additional usage of staff PPE, and extensive cleaning of exam room while observing appropriate contact time as indicated for disinfecting solutions.

## 2021-08-08 NOTE — Patient Instructions (Addendum)
Recommend to proceed with the following vaccine at your pharmacy:  Covid #3 Flu shot   GO TO THE LAB : Get the blood work     GO TO THE FRONT DESK, PLEASE SCHEDULE YOUR APPOINTMENTS Come back for a checkup in 6 months

## 2021-08-09 ENCOUNTER — Telehealth: Payer: Self-pay

## 2021-08-09 LAB — COMPREHENSIVE METABOLIC PANEL
ALT: 72 U/L — ABNORMAL HIGH (ref 0–53)
AST: 25 U/L (ref 0–37)
Albumin: 4.5 g/dL (ref 3.5–5.2)
Alkaline Phosphatase: 75 U/L (ref 39–117)
BUN: 17 mg/dL (ref 6–23)
CO2: 30 mEq/L (ref 19–32)
Calcium: 9.8 mg/dL (ref 8.4–10.5)
Chloride: 102 mEq/L (ref 96–112)
Creatinine, Ser: 1.09 mg/dL (ref 0.40–1.50)
GFR: 84.22 mL/min (ref >60.00)
Glucose, Bld: 103 mg/dL — ABNORMAL HIGH (ref 70–99)
Potassium: 4.2 mEq/L (ref 3.5–5.1)
Sodium: 140 mEq/L (ref 135–145)
Total Bilirubin: 0.6 mg/dL (ref 0.2–1.2)
Total Protein: 7.1 g/dL (ref 6.0–8.3)

## 2021-08-09 LAB — CBC WITH DIFFERENTIAL/PLATELET
Basophils Absolute: 0.1 10*3/uL (ref 0.0–0.1)
Basophils Relative: 1.1 % (ref 0.0–3.0)
Eosinophils Absolute: 0.3 10*3/uL (ref 0.0–0.7)
Eosinophils Relative: 3.3 % (ref 0.0–5.0)
HCT: 41.3 % (ref 39.0–52.0)
Hemoglobin: 14.1 g/dL (ref 13.0–17.0)
Lymphocytes Relative: 26.4 % (ref 12.0–46.0)
Lymphs Abs: 2 10*3/uL (ref 0.7–4.0)
MCHC: 34.1 g/dL (ref 30.0–36.0)
MCV: 95.5 fl (ref 78.0–100.0)
Monocytes Absolute: 0.8 10*3/uL (ref 0.1–1.0)
Monocytes Relative: 10 % (ref 3.0–12.0)
Neutro Abs: 4.6 10*3/uL (ref 1.4–7.7)
Neutrophils Relative %: 59.2 % (ref 43.0–77.0)
Platelets: 274 10*3/uL (ref 150.0–400.0)
RBC: 4.32 Mil/uL (ref 4.22–5.81)
RDW: 12.9 % (ref 11.5–15.5)
WBC: 7.8 10*3/uL (ref 4.0–10.5)

## 2021-08-09 LAB — LIPID PANEL
Cholesterol: 192 mg/dL (ref 0–200)
HDL: 42.9 mg/dL (ref >39.00)
NonHDL: 149.04
Total CHOL/HDL Ratio: 4
Triglycerides: 237 mg/dL — ABNORMAL HIGH (ref 0.0–149.0)
VLDL: 47.4 mg/dL — ABNORMAL HIGH (ref 0.0–40.0)

## 2021-08-09 LAB — LDL CHOLESTEROL, DIRECT: Direct LDL: 125 mg/dL

## 2021-08-09 LAB — TSH: TSH: 4.3 u[IU]/mL (ref 0.35–5.50)

## 2021-08-09 NOTE — Telephone Encounter (Signed)
This medication or product is on your plan's list of covered drugs. Prior authorization is not required at this time. If your pharmacy has questions regarding the processing of your prescription, please have them call the [facility] pharmacy help desk at [phone]. **Please note: This request was submitted electronically. Formulary lowering, tiering exception, cost reduction and/or pre-benefit determination review (including prospective Medicare hospice reviews) requests cannot be requested using this method of submission. Providers contact us at [phone] for further assistance.

## 2021-08-09 NOTE — Telephone Encounter (Signed)
PA initiated via Covermymeds; KEY: [id number]. Awaiting determination.

## 2021-08-12 LAB — DRUG MONITORING PANEL 375977 , URINE
Alcohol Metabolites: NEGATIVE ng/mL (ref <500)
Amphetamine: >15000 ng/mL — ABNORMAL HIGH (ref <250)
Amphetamines: POSITIVE ng/mL — AB (ref <500)
Barbiturates: NEGATIVE ng/mL (ref <300)
Benzodiazepines: NEGATIVE ng/mL (ref <100)
Cocaine Metabolite: NEGATIVE ng/mL (ref <150)
Desmethyltramadol: NEGATIVE ng/mL (ref <100)
Marijuana Metabolite: NEGATIVE ng/mL (ref <20)
Methamphetamine: NEGATIVE ng/mL (ref <250)
Opiates: NEGATIVE ng/mL (ref <100)
Oxycodone: NEGATIVE ng/mL (ref <100)
Tramadol: NEGATIVE ng/mL (ref <100)

## 2021-08-12 LAB — DM TEMPLATE

## 2021-08-30 ENCOUNTER — Encounter: Payer: Self-pay | Admitting: Internal Medicine

## 2021-08-30 ENCOUNTER — Other Ambulatory Visit: Payer: Self-pay | Admitting: Internal Medicine

## 2021-08-30 DIAGNOSIS — F988 Other specified behavioral and emotional disorders with onset usually occurring in childhood and adolescence: Secondary | ICD-10-CM

## 2021-08-30 MED ORDER — AMPHETAMINE-DEXTROAMPHETAMINE 30 MG PO TABS
30.0000 mg | ORAL_TABLET | Freq: Two times a day (BID) | ORAL | 0 refills | Status: DC | PRN
  Filled 2021-08-30: qty 60, 30d supply, fill #0

## 2021-08-31 ENCOUNTER — Other Ambulatory Visit (HOSPITAL_BASED_OUTPATIENT_CLINIC_OR_DEPARTMENT_OTHER): Payer: Self-pay

## 2021-09-01 ENCOUNTER — Other Ambulatory Visit (HOSPITAL_BASED_OUTPATIENT_CLINIC_OR_DEPARTMENT_OTHER): Payer: Self-pay

## 2021-09-30 ENCOUNTER — Other Ambulatory Visit: Payer: Self-pay | Admitting: Internal Medicine

## 2021-11-06 ENCOUNTER — Encounter: Payer: Self-pay | Admitting: Internal Medicine

## 2021-11-06 DIAGNOSIS — F988 Other specified behavioral and emotional disorders with onset usually occurring in childhood and adolescence: Secondary | ICD-10-CM

## 2021-11-07 MED ORDER — AMPHETAMINE-DEXTROAMPHETAMINE 30 MG PO TABS: 30.0000 mg | ORAL_TABLET | Freq: Two times a day (BID) | ORAL | 0 refills | Status: DC | PRN

## 2021-11-07 NOTE — Telephone Encounter (Signed)
PDMP okay, Rx sent 

## 2021-11-07 NOTE — Telephone Encounter (Signed)
Requesting: Adderall 30mg  Contract: 08/08/2021 UDS: 08/08/2021 Last Visit: 08/08/2021 Next Visit: None Last Refill: 08/30/2021 #60 and 0RF  Please Advise

## 2021-12-31 ENCOUNTER — Telehealth: Payer: Self-pay | Admitting: Internal Medicine

## 2021-12-31 DIAGNOSIS — F988 Other specified behavioral and emotional disorders with onset usually occurring in childhood and adolescence: Secondary | ICD-10-CM

## 2022-01-02 MED ORDER — AMPHETAMINE-DEXTROAMPHETAMINE 30 MG PO TABS: 30.0000 mg | ORAL_TABLET | Freq: Two times a day (BID) | ORAL | 0 refills | Status: DC | PRN

## 2022-01-02 NOTE — Telephone Encounter (Signed)
PDMP okay, Rx sent 

## 2022-01-02 NOTE — Telephone Encounter (Signed)
Requesting: Adderall 30mg   ?Contract: 08/08/2021 ?UDS: 08/08/2021 ?Last Visit: 08/08/2021 ?Next Visit: None ?Last Refill: 11/07/2021 #60 and 0RF (x2) ? ?Please Advise ? ?

## 2022-01-26 ENCOUNTER — Encounter: Payer: Self-pay | Admitting: Internal Medicine

## 2022-02-01 ENCOUNTER — Other Ambulatory Visit: Payer: Self-pay | Admitting: Internal Medicine

## 2022-02-01 DIAGNOSIS — F988 Other specified behavioral and emotional disorders with onset usually occurring in childhood and adolescence: Secondary | ICD-10-CM

## 2022-03-13 ENCOUNTER — Telehealth: Payer: Self-pay | Admitting: Internal Medicine

## 2022-03-13 DIAGNOSIS — F988 Other specified behavioral and emotional disorders with onset usually occurring in childhood and adolescence: Secondary | ICD-10-CM

## 2022-03-13 NOTE — Telephone Encounter (Signed)
Requesting: Adderall 30mg   Contract: 08/08/21 UDS: 08/08/21 Last Visit: 08/08/21 Next Visit: 03/17/22 Last Refill: 01/02/22 #60 and 0RF (x2)  Please Advise

## 2022-03-14 MED ORDER — AMPHETAMINE-DEXTROAMPHETAMINE 30 MG PO TABS: 30.0000 mg | ORAL_TABLET | Freq: Two times a day (BID) | ORAL | 0 refills | Status: DC | PRN

## 2022-03-14 NOTE — Telephone Encounter (Signed)
Pdmp ok, rx sent  ? ?

## 2022-03-17 ENCOUNTER — Encounter: Payer: Self-pay | Admitting: Internal Medicine

## 2022-03-17 ENCOUNTER — Ambulatory Visit (INDEPENDENT_AMBULATORY_CARE_PROVIDER_SITE_OTHER): Payer: No Typology Code available for payment source | Admitting: Internal Medicine

## 2022-03-17 VITALS — BP 126/74 | HR 106 | Temp 98.0°F | Resp 16 | Ht 70.0 in | Wt 219.0 lb

## 2022-03-17 DIAGNOSIS — R000 Tachycardia, unspecified: Secondary | ICD-10-CM | POA: Diagnosis not present

## 2022-03-17 DIAGNOSIS — F988 Other specified behavioral and emotional disorders with onset usually occurring in childhood and adolescence: Secondary | ICD-10-CM | POA: Diagnosis not present

## 2022-03-17 NOTE — Patient Instructions (Addendum)
Recommend to proceed with covid booster (bivalent) at your pharmacy.    GO TO THE FRONT DESK, PLEASE SCHEDULE YOUR APPOINTMENTS Come back for   a physical by October 2023

## 2022-03-17 NOTE — Progress Notes (Unsigned)
   Subjective:    Patient ID: Travis Dunn, male    DOB: 05-01-1981, 41 y.o.   MRN: [id number]  DOS:  03/17/2022 Type of visit - description: Routine checkup  Since the last office visit is doing well. Takes ADD meds correctly. He denies anxiety or depression. Sleeping well.  Noted him to be slightly tachycardic.  He denies chest pain or difficulty breathing.  No lower extremity edema Denies any palpitations.  Review of Systems See above   Past Medical History:  Diagnosis Date   ADD (attention deficit disorder)     Past Surgical History:  Procedure Laterality Date   APPENDECTOMY     has abscess    Current Outpatient Medications  Medication Instructions   amphetamine-dextroamphetamine (ADDERALL) 30 MG tablet 1 tablet, Oral, 2 times daily PRN   amphetamine-dextroamphetamine (ADDERALL) 30 MG tablet 1 tablet, Oral, 2 times daily PRN   atorvastatin (LIPITOR) 20 MG tablet TAKE 1 TABLET(20 MG) BY MOUTH AT BEDTIME       Objective:   Physical Exam BP 126/74   Pulse (!) 106   Temp 98 F (36.7 C) (Oral)   Resp 16   Ht 5\' 10"  (1.778 m)   Wt 219 lb (99.3 kg)   SpO2 98%   BMI 31.42 kg/m  General:   Well developed, NAD, BMI noted. HEENT:  Normocephalic . Face symmetric, atraumatic Lungs:  CTA B Normal respiratory effort, no intercostal retractions, no accessory muscle use. Heart: Slightly tachycardic, no murmur.  Lower extremities: no pretibial edema bilaterally  Skin: Not pale. Not jaundice Neurologic:  alert & oriented X3.  Speech normal, gait appropriate for age and unassisted Psych--  Cognition and judgment appear intact.  Cooperative with normal attention span and concentration.  Behavior appropriate. No anxious or depressed appearing.      Assessment    Assessment High cholesterol ADD Increased LFTs: Hep C and B serology (-) 10-2015. 12-2016: wnl a-1-antitrypsin/ceruloplasmine Covid infection 06-2020 (vaccines 02/2020)  PLAN: ADD PDMP reviewed and  okay. Symptoms well controlled without apparent side effects.  See next Mild tachycardia: Patient is asymptomatic, this is a second time he noted to be slightly tachycardic.  On chart review no anemia or thyroid disease.  Plan: Reassess on RTC. RTC 07-2022 CPX

## 2022-03-18 NOTE — Assessment & Plan Note (Signed)
ADD PDMP reviewed and okay. Symptoms well controlled without apparent side effects.  See next Mild tachycardia: Patient is asymptomatic, this is a second time he noted to be slightly tachycardic.  On chart review no anemia or thyroid disease.  Plan: Reassess on RTC. RTC 07-2022 CPX

## 2022-03-31 ENCOUNTER — Other Ambulatory Visit: Payer: Self-pay | Admitting: Internal Medicine

## 2022-05-16 ENCOUNTER — Telehealth: Payer: Self-pay | Admitting: Internal Medicine

## 2022-05-16 DIAGNOSIS — F988 Other specified behavioral and emotional disorders with onset usually occurring in childhood and adolescence: Secondary | ICD-10-CM

## 2022-05-16 MED ORDER — AMPHETAMINE-DEXTROAMPHETAMINE 30 MG PO TABS: 30.0000 mg | ORAL_TABLET | Freq: Two times a day (BID) | ORAL | 0 refills | Status: DC | PRN

## 2022-05-16 NOTE — Telephone Encounter (Signed)
PDMP okay, Rx sent 

## 2022-05-16 NOTE — Telephone Encounter (Signed)
Requesting: Adderall 30mg   Contract: 08/08/21 UDS: 08/08/21 Last Visit: 03/17/22 Next Visit: None Last Refill: 03/14/22 #60 and 0RF (x2)  Please Advise

## 2022-07-22 ENCOUNTER — Telehealth: Payer: Self-pay | Admitting: Internal Medicine

## 2022-07-22 DIAGNOSIS — F988 Other specified behavioral and emotional disorders with onset usually occurring in childhood and adolescence: Secondary | ICD-10-CM

## 2022-07-24 MED ORDER — AMPHETAMINE-DEXTROAMPHETAMINE 30 MG PO TABS: 30.0000 mg | ORAL_TABLET | Freq: Two times a day (BID) | ORAL | 0 refills | Status: DC | PRN

## 2022-07-24 NOTE — Telephone Encounter (Signed)
Requesting: Adderall 30mg   Contract:08/08/21 UDS:08/08/21 Last Visit: 03/17/22 Next Visit: None Last Refill: 05/16/22 #60 and 0RF (x2)  Please Advise

## 2022-07-24 NOTE — Telephone Encounter (Signed)
PDMP okay, Rx sent 

## 2022-09-05 ENCOUNTER — Encounter: Payer: Self-pay | Admitting: Internal Medicine

## 2022-09-05 ENCOUNTER — Encounter: Payer: No Typology Code available for payment source | Admitting: Internal Medicine

## 2022-09-05 ENCOUNTER — Ambulatory Visit (INDEPENDENT_AMBULATORY_CARE_PROVIDER_SITE_OTHER): Payer: No Typology Code available for payment source | Admitting: Internal Medicine

## 2022-09-05 VITALS — BP 124/68 | HR 100 | Temp 98.2°F | Resp 16 | Ht 70.0 in | Wt 230.0 lb

## 2022-09-05 DIAGNOSIS — Z23 Encounter for immunization: Secondary | ICD-10-CM

## 2022-09-05 DIAGNOSIS — F988 Other specified behavioral and emotional disorders with onset usually occurring in childhood and adolescence: Secondary | ICD-10-CM | POA: Diagnosis not present

## 2022-09-05 DIAGNOSIS — Z79899 Other long term (current) drug therapy: Secondary | ICD-10-CM | POA: Diagnosis not present

## 2022-09-05 DIAGNOSIS — E785 Hyperlipidemia, unspecified: Secondary | ICD-10-CM | POA: Diagnosis not present

## 2022-09-05 DIAGNOSIS — Z0000 Encounter for general adult medical examination without abnormal findings: Secondary | ICD-10-CM

## 2022-09-05 MED ORDER — CYCLOBENZAPRINE HCL 10 MG PO TABS: 10.0000 mg | ORAL_TABLET | Freq: Every evening | ORAL | 0 refills | Status: DC | PRN

## 2022-09-05 MED ORDER — MELOXICAM 7.5 MG PO TABS: 7.5000 mg | ORAL_TABLET | Freq: Every day | ORAL | 0 refills | Status: DC | PRN

## 2022-09-05 NOTE — Progress Notes (Unsigned)
   Subjective:    Patient ID: Travis Dunn, male    DOB: 1980/11/23, 41 y.o.   MRN: [id number]  DOS:  09/05/2022 Type of visit - description: Here for CPX  Here for CPX. Develop low back pain few days ago, more noticeable on the left.  No radiation.  No paresthesias.  No recent injury.  Review of Systems See above   Past Medical History:  Diagnosis Date   ADD (attention deficit disorder)     Past Surgical History:  Procedure Laterality Date   APPENDECTOMY     has abscess    Current Outpatient Medications  Medication Instructions   amphetamine-dextroamphetamine (ADDERALL) 30 MG tablet 1 tablet, Oral, 2 times daily PRN   amphetamine-dextroamphetamine (ADDERALL) 30 MG tablet 1 tablet, Oral, 2 times daily PRN   atorvastatin (LIPITOR) 20 MG tablet TAKE 1 TABLET(20 MG) BY MOUTH AT BEDTIME       Objective:   Physical Exam BP 124/68   Pulse 100   Temp 98.2 F (36.8 C) (Oral)   Resp 16   Ht 5\' 10"  (1.778 m)   Wt 230 lb (104.3 kg)   SpO2 97%   BMI 33.00 kg/m  General: Well developed, NAD, BMI noted Neck: No  thyromegaly  HEENT:  Normocephalic . Face symmetric, atraumatic Lungs:  CTA B Normal respiratory effort, no intercostal retractions, no accessory muscle use. Heart: RRR,  no murmur.  Abdomen:  Not distended, soft, non-tender. No rebound or rigidity.   Lower extremities: no pretibial edema bilaterally  Skin: Exposed areas without rash. Not pale. Not jaundice Neurologic:  alert & oriented X3.  Speech normal, gait and transferring somewhat antalgic. Strength symmetric and appropriate for age. DTR symmetric, straight leg test negative. Psych: Cognition and judgment appear intact.  Cooperative with normal attention span and concentration.  Behavior appropriate. No anxious or depressed appearing.      Assessment     Assessment High cholesterol ADD Increased LFTs: Hep C and B serology (-) 10-2015. 12-2016: wnl a-1-antitrypsin/ceruloplasmine Covid  infection 06-2020 (vaccines 02/2020)  PLAN: Here for CPX High cholesterol: On Lipitor, checking labs ADD: Symptoms controlled with current meds  Acute lumbalgia: As described above, recommend no heavy lifting, stop OTC NSAIDs and stick to meloxicam, heating pad, Flexeril.  Call if not better RTC 6 months.  -Td 2015 -Covid vaccine: d/w pt  - flu shot: Today -CCS: not indicated - Prostate ca screening: no indicated  - Diet, exercise: Rec healthy lifestyle. -EtOH: Again recommend<  2 servings a day. - labs CMP CBC FLP UDS      ADD PDMP reviewed and okay. Symptoms well controlled without apparent side effects.  See next Mild tachycardia: Patient is asymptomatic, this is a second time he noted to be slightly tachycardic.  On chart review no anemia or thyroid disease.  Plan: Reassess on RTC. RTC 07-2022 CPX

## 2022-09-05 NOTE — Patient Instructions (Addendum)
For back pain:  -No heavy lifting in the last few days  - instead of ibuprofen, naproxen or other  over-the-counter anti-inflammatories, take meloxicam 7.5 mg 1 or 2 tablets a day as needed for pain. Take it always take it with food because may cause gastritis and ulcers.  If you notice nausea, stomach pain, change in the color of stools --->  Stop the medicine and let us know  -Heating pad to 3 times a day  - Cyclobenzaprine, a muscle relaxant only at night.  Watch for oversedation  - Call if not gradually better     GO TO THE LAB : Get the blood work     GO TO THE FRONT DESK, PLEASE SCHEDULE YOUR APPOINTMENTS Come back for a checkup in 6 months

## 2022-09-06 ENCOUNTER — Encounter: Payer: Self-pay | Admitting: Internal Medicine

## 2022-09-06 LAB — COMPREHENSIVE METABOLIC PANEL
ALT: 81 U/L — ABNORMAL HIGH (ref 0–53)
AST: 31 U/L (ref 0–37)
Albumin: 4.8 g/dL (ref 3.5–5.2)
Alkaline Phosphatase: 87 U/L (ref 39–117)
BUN: 17 mg/dL (ref 6–23)
CO2: 31 mEq/L (ref 19–32)
Calcium: 10.3 mg/dL (ref 8.4–10.5)
Chloride: 99 mEq/L (ref 96–112)
Creatinine, Ser: 1.13 mg/dL (ref 0.40–1.50)
GFR: 80.05 mL/min (ref >60.00)
Glucose, Bld: 88 mg/dL (ref 70–99)
Potassium: 4.7 mEq/L (ref 3.5–5.1)
Sodium: 137 mEq/L (ref 135–145)
Total Bilirubin: 0.5 mg/dL (ref 0.2–1.2)
Total Protein: 7.9 g/dL (ref 6.0–8.3)

## 2022-09-06 LAB — CBC WITH DIFFERENTIAL/PLATELET
Basophils Absolute: 0.1 10*3/uL (ref 0.0–0.1)
Basophils Relative: 1.1 % (ref 0.0–3.0)
Eosinophils Absolute: 0.3 10*3/uL (ref 0.0–0.7)
Eosinophils Relative: 2.7 % (ref 0.0–5.0)
HCT: 48.5 % (ref 39.0–52.0)
Hemoglobin: 16.2 g/dL (ref 13.0–17.0)
Lymphocytes Relative: 16.8 % (ref 12.0–46.0)
Lymphs Abs: 2 10*3/uL (ref 0.7–4.0)
MCHC: 33.4 g/dL (ref 30.0–36.0)
MCV: 97.9 fl (ref 78.0–100.0)
Monocytes Absolute: 1 10*3/uL (ref 0.1–1.0)
Monocytes Relative: 8.3 % (ref 3.0–12.0)
Neutro Abs: 8.3 10*3/uL — ABNORMAL HIGH (ref 1.4–7.7)
Neutrophils Relative %: 71.1 % (ref 43.0–77.0)
Platelets: 348 10*3/uL (ref 150.0–400.0)
RBC: 4.96 Mil/uL (ref 4.22–5.81)
RDW: 13 % (ref 11.5–15.5)
WBC: 11.6 10*3/uL — ABNORMAL HIGH (ref 4.0–10.5)

## 2022-09-06 LAB — LIPID PANEL
Cholesterol: 246 mg/dL — ABNORMAL HIGH (ref 0–200)
HDL: 50.3 mg/dL (ref >39.00)
Total CHOL/HDL Ratio: 5
Triglycerides: 427 mg/dL — ABNORMAL HIGH (ref 0.0–149.0)

## 2022-09-06 LAB — LDL CHOLESTEROL, DIRECT: Direct LDL: 144 mg/dL

## 2022-09-06 NOTE — Assessment & Plan Note (Signed)
-  Td 2015 -Covid vaccine: d/w pt  - flu shot: Today -CCS: not indicated - Prostate ca screening: no indicated  - Diet, exercise: Rec healthy lifestyle. -EtOH: Again recommend <  2 servings a day. - labs CMP CBC FLP UDS

## 2022-09-06 NOTE — Assessment & Plan Note (Signed)
Here for CPX High cholesterol: On Lipitor, checking labs ADD: Symptoms controlled with current meds  Acute lumbalgia: Symptom onset few days ago, as described above, recommend no heavy lifting, stop OTC NSAIDs and stick to meloxicam, heating pad, Flexeril.  Call if not better RTC 6 months.

## 2022-09-07 LAB — DRUG MONITORING PANEL 375977 , URINE
Alcohol Metabolites: NEGATIVE ng/mL (ref <500)
Amphetamine: 6784 ng/mL — ABNORMAL HIGH (ref <250)
Amphetamines: POSITIVE ng/mL — AB (ref <500)
Barbiturates: NEGATIVE ng/mL (ref <300)
Benzodiazepines: NEGATIVE ng/mL (ref <100)
Cocaine Metabolite: NEGATIVE ng/mL (ref <150)
Desmethyltramadol: NEGATIVE ng/mL (ref <100)
Marijuana Metabolite: NEGATIVE ng/mL (ref <20)
Methamphetamine: NEGATIVE ng/mL (ref <250)
Opiates: NEGATIVE ng/mL (ref <100)
Oxycodone: NEGATIVE ng/mL (ref <100)
Tramadol: NEGATIVE ng/mL (ref <100)

## 2022-09-07 LAB — DM TEMPLATE

## 2022-09-11 ENCOUNTER — Encounter: Payer: Self-pay | Admitting: Internal Medicine

## 2022-09-18 ENCOUNTER — Other Ambulatory Visit: Payer: Self-pay | Admitting: Internal Medicine

## 2022-09-20 ENCOUNTER — Telehealth: Payer: Self-pay | Admitting: Internal Medicine

## 2022-09-20 DIAGNOSIS — F988 Other specified behavioral and emotional disorders with onset usually occurring in childhood and adolescence: Secondary | ICD-10-CM

## 2022-09-20 MED ORDER — AMPHETAMINE-DEXTROAMPHETAMINE 30 MG PO TABS: 30.0000 mg | ORAL_TABLET | Freq: Two times a day (BID) | ORAL | 0 refills | Status: DC | PRN

## 2022-09-20 NOTE — Telephone Encounter (Signed)
Requesting: Adderall 30mg   Contract: 08/08/21 UDS: 09/05/22 Last Visit: 09/05/22 Next Visit: None Last Refill: 07/24/22 #60 and 0RF (x2)  Please Advise

## 2022-09-20 NOTE — Telephone Encounter (Signed)
  PDMP okay, rx sent  

## 2022-09-27 ENCOUNTER — Other Ambulatory Visit: Payer: Self-pay | Admitting: Internal Medicine

## 2022-09-27 DIAGNOSIS — F988 Other specified behavioral and emotional disorders with onset usually occurring in childhood and adolescence: Secondary | ICD-10-CM

## 2022-09-27 NOTE — Telephone Encounter (Signed)
[facility] on [address] is out of stock. Would you mind calling this in at the [facility] on [address]?

## 2022-09-28 MED ORDER — AMPHETAMINE-DEXTROAMPHETAMINE 30 MG PO TABS: 30.0000 mg | ORAL_TABLET | Freq: Two times a day (BID) | ORAL | 0 refills | Status: DC | PRN

## 2022-09-28 NOTE — Telephone Encounter (Signed)
Spoke w/ [facility]- Pt did not pick up last week- they currently do not have in stock.

## 2022-09-28 NOTE — Telephone Encounter (Signed)
Tried [facility], closed until 9am, will try again later.

## 2022-10-09 ENCOUNTER — Other Ambulatory Visit: Payer: Self-pay | Admitting: Internal Medicine

## 2022-10-30 ENCOUNTER — Telehealth: Payer: Self-pay | Admitting: Internal Medicine

## 2022-10-30 DIAGNOSIS — F988 Other specified behavioral and emotional disorders with onset usually occurring in childhood and adolescence: Secondary | ICD-10-CM

## 2022-10-30 MED ORDER — AMPHETAMINE-DEXTROAMPHETAMINE 30 MG PO TABS: 30.0000 mg | ORAL_TABLET | Freq: Two times a day (BID) | ORAL | 0 refills | Status: DC | PRN

## 2022-10-30 NOTE — Telephone Encounter (Signed)
PDMP reviewed again, prescription sent

## 2022-10-30 NOTE — Telephone Encounter (Signed)
Pt's normal pharmacy [facility] does not have Adderall 30mg  in stock- requesting for [facility] to [facility] [address] [facility]

## 2022-10-30 NOTE — Telephone Encounter (Signed)
Requesting: Adderall Contract: 08/08/2021 UDS: 09/05/2022 Last Visit: 09/05/2022 Next Visit:N/A Last Refill: 09/20/2022  Please Advise

## 2022-12-06 ENCOUNTER — Telehealth: Payer: Self-pay | Admitting: Internal Medicine

## 2022-12-06 DIAGNOSIS — F988 Other specified behavioral and emotional disorders with onset usually occurring in childhood and adolescence: Secondary | ICD-10-CM

## 2022-12-06 NOTE — Telephone Encounter (Signed)
Requesting: Adderall 30MG  Contract: 08/08/21 UDS: 09/05/22 Last Visit: 09/05/22 Next Visit: None Last Refill: 09/28/22 #60 and 0RF (x2)  Please Advise

## 2022-12-07 MED ORDER — AMPHETAMINE-DEXTROAMPHETAMINE 30 MG PO TABS: 30.0000 mg | ORAL_TABLET | Freq: Two times a day (BID) | ORAL | 0 refills | Status: DC | PRN

## 2022-12-07 NOTE — Telephone Encounter (Signed)
PDMP okay, Rx sent 

## 2023-02-02 ENCOUNTER — Encounter: Payer: Self-pay | Admitting: Internal Medicine

## 2023-02-11 ENCOUNTER — Telehealth: Payer: Self-pay | Admitting: Internal Medicine

## 2023-02-11 DIAGNOSIS — F988 Other specified behavioral and emotional disorders with onset usually occurring in childhood and adolescence: Secondary | ICD-10-CM

## 2023-02-12 MED ORDER — AMPHETAMINE-DEXTROAMPHETAMINE 30 MG PO TABS: 30.0000 mg | ORAL_TABLET | Freq: Two times a day (BID) | ORAL | 0 refills | Status: DC | PRN

## 2023-02-12 NOTE — Telephone Encounter (Signed)
PDMP okay, Rx sent 

## 2023-02-12 NOTE — Telephone Encounter (Signed)
Requesting: Adderall   Contract: 08/08/21 UDS: 09/05/22 Last Visit: 09/05/22 Next Visit: None Last Refill: 12/07/22 #60 and 0RF (x2)  Please Advise

## 2023-04-10 ENCOUNTER — Other Ambulatory Visit: Payer: Self-pay | Admitting: Internal Medicine

## 2023-04-23 ENCOUNTER — Telehealth: Payer: Self-pay | Admitting: Internal Medicine

## 2023-04-23 DIAGNOSIS — F988 Other specified behavioral and emotional disorders with onset usually occurring in childhood and adolescence: Secondary | ICD-10-CM

## 2023-04-23 MED ORDER — AMPHETAMINE-DEXTROAMPHETAMINE 30 MG PO TABS: 30.0000 mg | ORAL_TABLET | Freq: Two times a day (BID) | ORAL | 0 refills | Status: DC | PRN

## 2023-04-23 NOTE — Telephone Encounter (Signed)
Requesting: adderall 30mg   Contract: 09/06/21 UDS: 09/05/22 Last Visit: 09/05/22 Next Visit: 05/08/23 Last Refill: 02/12/23 #30 and 0RF (x2)  Please Advise

## 2023-04-23 NOTE — Telephone Encounter (Signed)
PDMP okay, Rx sent 

## 2023-05-08 ENCOUNTER — Ambulatory Visit: Payer: No Typology Code available for payment source | Admitting: Internal Medicine

## 2023-05-10 ENCOUNTER — Other Ambulatory Visit: Payer: Self-pay | Admitting: Internal Medicine

## 2023-05-11 ENCOUNTER — Ambulatory Visit (INDEPENDENT_AMBULATORY_CARE_PROVIDER_SITE_OTHER): Payer: No Typology Code available for payment source | Admitting: Internal Medicine

## 2023-05-11 VITALS — BP 122/80 | HR 94 | Temp 97.6°F | Resp 18 | Ht 70.0 in | Wt 226.8 lb

## 2023-05-11 DIAGNOSIS — Z79899 Other long term (current) drug therapy: Secondary | ICD-10-CM

## 2023-05-11 DIAGNOSIS — F988 Other specified behavioral and emotional disorders with onset usually occurring in childhood and adolescence: Secondary | ICD-10-CM

## 2023-05-11 DIAGNOSIS — R7989 Other specified abnormal findings of blood chemistry: Secondary | ICD-10-CM

## 2023-05-11 NOTE — Progress Notes (Unsigned)
   Subjective:    Patient ID: Travis Dunn, male    DOB: 06-20-1981, 42 y.o.   MRN: [id number]  DOS:  05/11/2023 Type of visit - description: f/u   Chronic medical problems addressed Feels well Denies anxiety, depression insomnia Room for improvement on life style  Wt Readings from Last 3 Encounters:  05/11/23 226 lb 12.8 oz (102.9 kg)  09/05/22 230 lb (104.3 kg)  03/17/22 219 lb (99.3 kg)      Review of Systems See above   Past Medical History:  Diagnosis Date   ADD (attention deficit disorder)     Past Surgical History:  Procedure Laterality Date   APPENDECTOMY     has abscess    Current Outpatient Medications  Medication Instructions   amphetamine-dextroamphetamine (ADDERALL) 30 MG tablet 1 tablet, Oral, 2 times daily PRN   amphetamine-dextroamphetamine (ADDERALL) 30 MG tablet 1 tablet, Oral, 2 times daily PRN   atorvastatin (LIPITOR) 20 mg, Oral, Daily at bedtime       Objective:   Physical Exam BP 122/80 (BP Location: Left Arm, Patient Position: Sitting, Cuff Size: Normal)   Pulse 94   Temp 97.6 F (36.4 C) (Temporal)   Resp 18   Ht 5\' 10"  (1.778 m)   Wt 226 lb 12.8 oz (102.9 kg)   SpO2 99%   BMI 32.54 kg/m  General:   Well developed, NAD, BMI noted. HEENT:  Normocephalic . Face symmetric, atraumatic Lungs:  CTA B Normal respiratory effort, no intercostal retractions, no accessory muscle use. Heart: RRR,  no murmur.  Lower extremities: no pretibial edema bilaterally  Skin: Not pale. Not jaundice Neurologic:  alert & oriented X3.  Speech normal, gait appropriate for age and unassisted Psych--  Cognition and judgment appear intact.  Cooperative with normal attention span and concentration.  Behavior appropriate. No anxious or depressed appearing.      Assessment   Assessment High cholesterol ADD Increased LFTs: Hep C and B serology (-) 10-2015. 12-2016: wnl a-1-antitrypsin/ceruloplasmine Covid infection 06-2020 (vaccines  02/2020)  PLAN: ADD: controlled, cont adderall , UDS and contract today, UDS Elevated LFTs: monitoring lft today High Chol: last FLP w/ increased T. Chol and LDL, extensive discussion about a healthy diet and increase exercise, recheck on RTC RTC 08-2023    Here for CPX High cholesterol: On Lipitor, checking labs ADD: Symptoms controlled with current meds  Acute lumbalgia: Symptom onset few days ago, as described above, recommend no heavy lifting, stop OTC NSAIDs and stick to meloxicam, heating pad, Flexeril.  Call if not better RTC 6 months.   The 10-year ASCVD risk score (Arnett DK, et al., 2019) is: 2.1%   Values used to calculate the score:     Age: 50 years     Sex: Male     Is Non-Hispanic African American: No     Diabetic: No     Tobacco smoker: No     Systolic Blood Pressure: 122 mmHg     Is BP treated: No     HDL Cholesterol: 50.3 mg/dL     Total Cholesterol: 246 mg/dL

## 2023-05-11 NOTE — Patient Instructions (Signed)
Recommend a flu shot this fall  GO TO THE LAB : Get the blood work     GO TO THE FRONT DESK, PLEASE SCHEDULE YOUR APPOINTMENTS Come back for  a physical exam by 08/2023

## 2023-05-12 LAB — HEPATIC FUNCTION PANEL
AG Ratio: 1.5 (calc) (ref 1.0–2.5)
ALT: 70 U/L — ABNORMAL HIGH (ref 9–46)
AST: 26 U/L (ref 10–40)
Albumin: 4.4 g/dL (ref 3.6–5.1)
Alkaline phosphatase (APISO): 92 U/L (ref 36–130)
Bilirubin, Direct: 0.1 mg/dL (ref 0.0–0.2)
Globulin: 2.9 g/dL (calc) (ref 1.9–3.7)
Indirect Bilirubin: 0.6 mg/dL (calc) (ref 0.2–1.2)
Total Bilirubin: 0.7 mg/dL (ref 0.2–1.2)
Total Protein: 7.3 g/dL (ref 6.1–8.1)

## 2023-05-13 LAB — DRUG MONITORING PANEL 376104, URINE
Amphetamine: >15000 ng/mL — ABNORMAL HIGH (ref <250)
Amphetamines: POSITIVE ng/mL — AB (ref <500)
Barbiturates: NEGATIVE ng/mL (ref <300)
Benzodiazepines: NEGATIVE ng/mL (ref <100)
Cocaine Metabolite: NEGATIVE ng/mL (ref <150)
Desmethyltramadol: NEGATIVE ng/mL (ref <100)
Methamphetamine: NEGATIVE ng/mL (ref <250)
Opiates: NEGATIVE ng/mL (ref <100)
Oxycodone: NEGATIVE ng/mL (ref <100)
Tramadol: NEGATIVE ng/mL (ref <100)

## 2023-05-13 LAB — DM TEMPLATE

## 2023-05-13 NOTE — Assessment & Plan Note (Signed)
ADD: controlled, cont adderall , UDS and contract today Elevated LFTs: monitoring lft today High Chol: last FLP w/ increased T. Chol and LDL, extensive discussion about a healthy diet and increase exercise, recheck on RTC RTC 08-2023

## 2023-05-28 ENCOUNTER — Other Ambulatory Visit: Payer: Self-pay | Admitting: Internal Medicine

## 2023-05-28 DIAGNOSIS — F988 Other specified behavioral and emotional disorders with onset usually occurring in childhood and adolescence: Secondary | ICD-10-CM

## 2023-05-30 MED ORDER — AMPHETAMINE-DEXTROAMPHETAMINE 30 MG PO TABS
30.0000 mg | ORAL_TABLET | Freq: Two times a day (BID) | ORAL | 0 refills | Status: DC | PRN
Start: 2023-05-30 — End: 2023-07-30

## 2023-05-30 NOTE — Telephone Encounter (Signed)
PDMP okay, Rx sent 

## 2023-05-30 NOTE — Telephone Encounter (Signed)
Requesting: adderall Contract:05/18/23 UDS:05/18/23 Last Visit:05/11/23 Next Visit:n/a Last Refill:04/23/23  Please Advise

## 2023-06-09 ENCOUNTER — Other Ambulatory Visit: Payer: Self-pay | Admitting: Internal Medicine

## 2023-07-29 ENCOUNTER — Telehealth: Payer: Self-pay | Admitting: Internal Medicine

## 2023-07-29 DIAGNOSIS — F909 Attention-deficit hyperactivity disorder, unspecified type: Secondary | ICD-10-CM

## 2023-07-29 DIAGNOSIS — F988 Other specified behavioral and emotional disorders with onset usually occurring in childhood and adolescence: Secondary | ICD-10-CM

## 2023-07-30 DIAGNOSIS — F988 Other specified behavioral and emotional disorders with onset usually occurring in childhood and adolescence: Secondary | ICD-10-CM | POA: Insufficient documentation

## 2023-07-30 MED ORDER — AMPHETAMINE-DEXTROAMPHETAMINE 30 MG PO TABS
30.0000 mg | ORAL_TABLET | Freq: Two times a day (BID) | ORAL | 0 refills | Status: DC | PRN
Start: 2023-07-30 — End: 2023-11-08

## 2023-07-30 NOTE — Telephone Encounter (Signed)
Requesting: Adderall 30MG   Contract: 05/11/23 UDS: 05/11/23 Last Visit: 05/11/23 Next Visit: None Last Refill: 04/23/23 #30 and 0RF (x3)  Please Advise

## 2023-07-30 NOTE — Telephone Encounter (Signed)
PDMP okay, Rx sent 

## 2023-09-12 ENCOUNTER — Other Ambulatory Visit: Payer: Self-pay | Admitting: Internal Medicine

## 2023-10-02 ENCOUNTER — Other Ambulatory Visit: Payer: Self-pay | Admitting: Internal Medicine

## 2023-10-02 DIAGNOSIS — F988 Other specified behavioral and emotional disorders with onset usually occurring in childhood and adolescence: Secondary | ICD-10-CM

## 2023-10-29 ENCOUNTER — Encounter: Payer: No Typology Code available for payment source | Admitting: Internal Medicine

## 2023-11-08 ENCOUNTER — Telehealth: Payer: Self-pay | Admitting: Internal Medicine

## 2023-11-08 DIAGNOSIS — F988 Other specified behavioral and emotional disorders with onset usually occurring in childhood and adolescence: Secondary | ICD-10-CM

## 2023-11-08 MED ORDER — AMPHETAMINE-DEXTROAMPHETAMINE 30 MG PO TABS: 30.0000 mg | ORAL_TABLET | Freq: Two times a day (BID) | ORAL | 0 refills | Status: DC | PRN

## 2023-11-08 NOTE — Telephone Encounter (Signed)
Requesting: Adderall 30mg   Contract: 05/11/23 UDS: 05/11/23 Last Visit: 05/11/23 Next Visit: 12/11/23 Last Refill: see med list  Please Advise

## 2023-11-08 NOTE — Telephone Encounter (Signed)
pdmp okay, Rx sent

## 2023-12-11 ENCOUNTER — Encounter: Payer: Self-pay | Admitting: Internal Medicine

## 2023-12-11 ENCOUNTER — Ambulatory Visit (INDEPENDENT_AMBULATORY_CARE_PROVIDER_SITE_OTHER): Payer: Commercial Managed Care - PPO | Admitting: Internal Medicine

## 2023-12-11 VITALS — BP 116/82 | HR 95 | Temp 98.1°F | Resp 16 | Ht 70.0 in | Wt 223.1 lb

## 2023-12-11 DIAGNOSIS — Z79899 Other long term (current) drug therapy: Secondary | ICD-10-CM

## 2023-12-11 DIAGNOSIS — R7989 Other specified abnormal findings of blood chemistry: Secondary | ICD-10-CM

## 2023-12-11 DIAGNOSIS — E785 Hyperlipidemia, unspecified: Secondary | ICD-10-CM | POA: Diagnosis not present

## 2023-12-11 DIAGNOSIS — F909 Attention-deficit hyperactivity disorder, unspecified type: Secondary | ICD-10-CM

## 2023-12-11 DIAGNOSIS — Z0001 Encounter for general adult medical examination with abnormal findings: Secondary | ICD-10-CM

## 2023-12-11 DIAGNOSIS — Z23 Encounter for immunization: Secondary | ICD-10-CM

## 2023-12-11 DIAGNOSIS — Z0000 Encounter for general adult medical examination without abnormal findings: Secondary | ICD-10-CM

## 2023-12-11 MED ORDER — SILDENAFIL CITRATE 20 MG PO TABS: 60.0000 mg | ORAL_TABLET | Freq: Every evening | ORAL | 3 refills | Status: DC | PRN

## 2023-12-11 NOTE — Patient Instructions (Addendum)
 We are ordering a ultrasound of your abdomen. Expect a phone call from [facility] imaging. If you do not hear from them in the next few days please let us know  For tennis elbow: Please use tennis elbow brace when you are active. You can use Tylenol and occasionally ibuprofen as needed. If you are not better let me know.  GO TO THE LAB : Get the blood work     Next visit with me in 6 months    Please schedule it at the front desk

## 2023-12-11 NOTE — Progress Notes (Unsigned)
   Subjective:    Patient ID: Travis Dunn, male    DOB: 1981/01/04, 43 y.o.   MRN: [id number]  DOS:  12/11/2023 Type of visit - description: CPX  Here for CPX. Reports 1 year history of occasional difficulty with erections, both achieving and maintaining. Treatment?. Also, for the last 2 months, the left elbow is bothering him sometimes.  Complaining of pain and stiffness in the morning. No swelling, no injury.   Review of Systems See above   Past Medical History:  Diagnosis Date   ADD (attention deficit disorder)     Past Surgical History:  Procedure Laterality Date   APPENDECTOMY     has abscess    Current Outpatient Medications  Medication Instructions   amphetamine-dextroamphetamine (ADDERALL) 30 MG tablet 1 tablet, Oral, 2 times daily PRN   amphetamine-dextroamphetamine (ADDERALL) 30 MG tablet 1 tablet, Oral, 2 times daily PRN   atorvastatin (LIPITOR) 20 mg, Oral, Daily at bedtime       Objective:   Physical Exam BP 116/82   Pulse 95   Temp 98.1 F (36.7 C) (Oral)   Resp 16   Ht 5\' 10"  (1.778 m)   Wt 223 lb 2 oz (101.2 kg)   SpO2 98%   BMI 32.02 kg/m  General: Well developed, NAD, BMI noted Neck: No  thyromegaly  HEENT:  Normocephalic . Face symmetric, atraumatic Lungs:  CTA B Normal respiratory effort, no intercostal retractions, no accessory muscle use. Heart: RRR,  no murmur.  Abdomen:  Not distended, soft, non-tender. No rebound or rigidity.   Lower extremities: no pretibial edema bilaterally  Upper extremities: Normal to inspection, symmetric. Left elbow: Slightly TTP at the lateral epicondyles. Skin: Exposed areas without rash. Not pale. Not jaundice Neurologic:  alert & oriented X3.  Speech normal, gait appropriate for age and unassisted Strength symmetric and appropriate for age.  Psych: Cognition and judgment appear intact.  Cooperative with normal attention span and concentration.  Behavior appropriate. No anxious or depressed  appearing.     Assessment     Assessment High cholesterol ADD Increased LFTs: Hep C and B serology (-) 10-2015. 12-2016: wnl a-1-antitrypsin/ceruloplasmine   PLAN: Here for CPX  - Td: Today - had a flu shot - Covid vaccine: Recommended -CCS: not indicated - Prostate ca screening: no indicated  - Diet, exercise: Discussed. -EtOH: Again recommend <  2 servings a day. - labs CMP FLP CBC TSH. ADD: Well-controlled Elevated LFTs: Previous blood work negative, could be EtOH related, check ultrasound which has not done before. ED: Some problems with erectile dysfunction, treatment?  I do not see a contraindication, how to use and what to expect from sildenafil discussed.  Prescription sent. Left tennis elbow: Symptoms consistent with tennis elbow, information provided, recommend tennis elbow brace.  If no better, sports medicine referral would be appropriate. RTC 6 months.  === ADD: controlled, cont adderall , UDS and contract today Elevated LFTs: monitoring lft today High Chol: last FLP w/ increased T. Chol and LDL, extensive discussion about a healthy diet and increase exercise, recheck on RTC RTC 08-2023

## 2023-12-12 ENCOUNTER — Telehealth: Payer: Self-pay | Admitting: Internal Medicine

## 2023-12-12 ENCOUNTER — Encounter: Payer: Self-pay | Admitting: Internal Medicine

## 2023-12-12 DIAGNOSIS — F988 Other specified behavioral and emotional disorders with onset usually occurring in childhood and adolescence: Secondary | ICD-10-CM

## 2023-12-12 LAB — CBC WITH DIFFERENTIAL/PLATELET
Absolute Lymphocytes: 2120 {cells}/uL (ref 850–3900)
Absolute Monocytes: 676 {cells}/uL (ref 200–950)
Basophils Absolute: 61 {cells}/uL (ref 0–200)
Basophils Relative: 0.8 %
Eosinophils Absolute: 236 {cells}/uL (ref 15–500)
Eosinophils Relative: 3.1 %
HCT: 45.6 % (ref 38.5–50.0)
Hemoglobin: 15.3 g/dL (ref 13.2–17.1)
MCH: 32.1 pg (ref 27.0–33.0)
MCHC: 33.6 g/dL (ref 32.0–36.0)
MCV: 95.6 fL (ref 80.0–100.0)
MPV: 9.9 fL (ref 7.5–12.5)
Monocytes Relative: 8.9 %
Neutro Abs: 4507 {cells}/uL (ref 1500–7800)
Neutrophils Relative %: 59.3 %
Platelets: 323 10*3/uL (ref 140–400)
RBC: 4.77 10*6/uL (ref 4.20–5.80)
RDW: 12.4 % (ref 11.0–15.0)
Total Lymphocyte: 27.9 %
WBC: 7.6 10*3/uL (ref 3.8–10.8)

## 2023-12-12 LAB — COMPREHENSIVE METABOLIC PANEL
AG Ratio: 1.7 (calc) (ref 1.0–2.5)
ALT: 66 U/L — ABNORMAL HIGH (ref 9–46)
AST: 26 U/L (ref 10–40)
Albumin: 4.5 g/dL (ref 3.6–5.1)
Alkaline phosphatase (APISO): 75 U/L (ref 36–130)
BUN: 16 mg/dL (ref 7–25)
CO2: 27 mmol/L (ref 20–32)
Calcium: 10.1 mg/dL (ref 8.6–10.3)
Chloride: 101 mmol/L (ref 98–110)
Creat: 1.16 mg/dL (ref 0.60–1.29)
Globulin: 2.7 g/dL (ref 1.9–3.7)
Glucose, Bld: 116 mg/dL — ABNORMAL HIGH (ref 65–99)
Potassium: 4.5 mmol/L (ref 3.5–5.3)
Sodium: 138 mmol/L (ref 135–146)
Total Bilirubin: 0.7 mg/dL (ref 0.2–1.2)
Total Protein: 7.2 g/dL (ref 6.1–8.1)

## 2023-12-12 LAB — LIPID PANEL
Cholesterol: 196 mg/dL (ref <200)
HDL: 54 mg/dL (ref >=40)
LDL Cholesterol (Calc): 117 mg/dL — ABNORMAL HIGH
Non-HDL Cholesterol (Calc): 142 mg/dL — ABNORMAL HIGH (ref <130)
Total CHOL/HDL Ratio: 3.6 (calc) (ref <5.0)
Triglycerides: 141 mg/dL (ref <150)

## 2023-12-12 LAB — TSH: TSH: 2.7 m[IU]/L (ref 0.40–4.50)

## 2023-12-12 NOTE — Assessment & Plan Note (Addendum)
 Here for CPX Also discussed the following. ADD: Well-controlled Elevated LFTs: Previous blood work negative, could be EtOH related, check ultrasound which has not done before. ED: new issue, some problems with erectile dysfunction, treatment?  I do not see a contraindication, how to use and what to expect from sildenafil discussed.  Prescription sent. Left tennis elbow: Symptoms consistent with tennis elbow, information provided, recommend tennis elbow brace.  If no better, sports medicine referral would be appropriate. RTC 6 months.

## 2023-12-12 NOTE — Telephone Encounter (Signed)
 Requesting: adderall Contract:05/18/23 UDS:05/18/23 Last Visit:05/11/23 Next Visit:12/18/2023 Last Refill:11/08/2023 #60 with no refills   Please Advise

## 2023-12-12 NOTE — Assessment & Plan Note (Signed)
 Here for CPX - Td: Today - had a flu shot - Covid vaccine: Recommended -CCS: not indicated - Prostate ca screening: no indicated  - Diet, exercise: Discussed. -EtOH: Again recommend <  2 servings a day. - labs CMP FLP CBC TSH.

## 2023-12-13 LAB — DRUG MONITORING PANEL 375977 , URINE
Alcohol Metabolites: POSITIVE ng/mL — AB (ref <500)
Amphetamine: >15000 ng/mL — ABNORMAL HIGH (ref <250)
Amphetamines: POSITIVE ng/mL — AB (ref <500)
Barbiturates: NEGATIVE ng/mL (ref <300)
Benzodiazepines: NEGATIVE ng/mL (ref <100)
Cocaine Metabolite: NEGATIVE ng/mL (ref <150)
Desmethyltramadol: NEGATIVE ng/mL (ref <100)
Ethyl Glucuronide (ETG): 4106 ng/mL — ABNORMAL HIGH (ref <500)
Ethyl Sulfate (ETS): 1383 ng/mL — ABNORMAL HIGH (ref <100)
Marijuana Metabolite: NEGATIVE ng/mL (ref <20)
Methamphetamine: NEGATIVE ng/mL (ref <250)
Opiates: NEGATIVE ng/mL (ref <100)
Oxycodone: NEGATIVE ng/mL (ref <100)
Tramadol: NEGATIVE ng/mL (ref <100)

## 2023-12-13 LAB — DM TEMPLATE

## 2023-12-13 MED ORDER — AMPHETAMINE-DEXTROAMPHETAMINE 30 MG PO TABS: 30.0000 mg | ORAL_TABLET | Freq: Two times a day (BID) | ORAL | 0 refills | Status: DC | PRN

## 2023-12-13 NOTE — Telephone Encounter (Signed)
 PDMP okay, Rx sent

## 2023-12-14 ENCOUNTER — Encounter: Payer: Self-pay | Admitting: Internal Medicine

## 2023-12-14 ENCOUNTER — Other Ambulatory Visit: Payer: Self-pay

## 2023-12-14 DIAGNOSIS — R739 Hyperglycemia, unspecified: Secondary | ICD-10-CM

## 2023-12-18 ENCOUNTER — Ambulatory Visit
Admission: RE | Admit: 2023-12-18 | Discharge: 2023-12-18 | Disposition: A | Discharge status: Home / Self Care | Payer: Commercial Managed Care - PPO | Source: Ambulatory Visit | Attending: Internal Medicine | Admitting: Internal Medicine

## 2023-12-18 ENCOUNTER — Encounter: Payer: Self-pay | Admitting: Internal Medicine

## 2024-03-03 ENCOUNTER — Other Ambulatory Visit: Payer: Self-pay | Admitting: Internal Medicine

## 2024-03-27 ENCOUNTER — Encounter: Payer: Self-pay | Admitting: Internal Medicine

## 2024-03-27 DIAGNOSIS — F988 Other specified behavioral and emotional disorders with onset usually occurring in childhood and adolescence: Secondary | ICD-10-CM

## 2024-03-27 MED ORDER — AMPHETAMINE-DEXTROAMPHETAMINE 30 MG PO TABS: 30.0000 mg | ORAL_TABLET | Freq: Two times a day (BID) | ORAL | 0 refills | Status: DC | PRN

## 2024-03-27 NOTE — Telephone Encounter (Signed)
 Requesting: Adderall 30mg   Contract: 05/18/23 UDS: 12/11/23 Last Visit: 12/11/23 Next Visit: None Last Refill: 12/13/23 #30 and 0RF (x2)  Please Advise

## 2024-03-27 NOTE — Telephone Encounter (Signed)
Pdmp ok, rx sent  ? ?

## 2024-04-22 ENCOUNTER — Other Ambulatory Visit: Payer: Self-pay | Admitting: Internal Medicine

## 2024-06-02 ENCOUNTER — Encounter: Payer: Self-pay | Admitting: Internal Medicine

## 2024-06-02 DIAGNOSIS — F988 Other specified behavioral and emotional disorders with onset usually occurring in childhood and adolescence: Secondary | ICD-10-CM

## 2024-06-03 MED ORDER — AMPHETAMINE-DEXTROAMPHETAMINE 30 MG PO TABS
30.0000 mg | ORAL_TABLET | Freq: Two times a day (BID) | ORAL | 0 refills | Status: DC | PRN
Start: 2024-06-03 — End: 2024-07-11

## 2024-06-03 NOTE — Telephone Encounter (Signed)
 Requesting: Adderall  Contract: 05/18/23 UDS: 12/11/23 Last Visit: 12/11/23 Next Visit: None Last Refill: 03/27/24 #60 and 0RF (x2)  Please Advise

## 2024-06-03 NOTE — Telephone Encounter (Signed)
 PDMP okay, Rx sent

## 2024-06-05 ENCOUNTER — Other Ambulatory Visit: Payer: Self-pay | Admitting: Internal Medicine

## 2024-07-07 ENCOUNTER — Other Ambulatory Visit: Payer: Self-pay | Admitting: Internal Medicine

## 2024-07-11 ENCOUNTER — Ambulatory Visit: Admitting: Internal Medicine

## 2024-07-11 ENCOUNTER — Encounter: Payer: Self-pay | Admitting: Internal Medicine

## 2024-07-11 VITALS — BP 120/80 | HR 68 | Temp 98.1°F | Resp 16 | Ht 70.0 in | Wt 225.1 lb

## 2024-07-11 DIAGNOSIS — F988 Other specified behavioral and emotional disorders with onset usually occurring in childhood and adolescence: Secondary | ICD-10-CM | POA: Diagnosis not present

## 2024-07-11 DIAGNOSIS — K760 Fatty (change of) liver, not elsewhere classified: Secondary | ICD-10-CM | POA: Diagnosis not present

## 2024-07-11 DIAGNOSIS — Z23 Encounter for immunization: Secondary | ICD-10-CM | POA: Diagnosis not present

## 2024-07-11 MED ORDER — AMPHETAMINE-DEXTROAMPHETAMINE 30 MG PO TABS: 30.0000 mg | ORAL_TABLET | Freq: Two times a day (BID) | ORAL | 0 refills | Status: AC | PRN

## 2024-07-11 NOTE — Patient Instructions (Addendum)
 You got a flu shot today. Recommend COVID booster this fall  I sent your prescription for adderall    Go to the front desk for the checkout Please make an appointment for a physical exam by February 2026

## 2024-07-11 NOTE — Progress Notes (Signed)
   Subjective:    Patient ID: Travis Dunn, male    DOB: 12-05-1980, 43 y.o.   MRN: [id number]  DOS:  07/11/2024 Type of visit - description: Follow-up  Feeling well. Wants to discuss the results of his abdominal ultrasound.  Denies nausea or vomiting.  No diarrhea. Some stress but he is managing well.  Adderall doing the job.  Wt Readings from Last 3 Encounters:  07/11/24 225 lb 2 oz (102.1 kg)  12/11/23 223 lb 2 oz (101.2 kg)  05/11/23 226 lb 12.8 oz (102.9 kg)     Review of Systems See above   Past Medical History:  Diagnosis Date   ADD (attention deficit disorder)     Past Surgical History:  Procedure Laterality Date   APPENDECTOMY     has abscess    Current Outpatient Medications  Medication Instructions   amphetamine -dextroamphetamine  (ADDERALL) 30 MG tablet 1 tablet, Oral, 2 times daily PRN   amphetamine -dextroamphetamine  (ADDERALL) 30 MG tablet 1 tablet, Oral, 2 times daily PRN   atorvastatin  (LIPITOR) 20 mg, Oral, Daily at bedtime, Due for appt   sildenafil  (REVATIO ) 20 MG tablet TAKE 3 TO 4 TABLETS(60 TO 80 MG) BY MOUTH AT BEDTIME AS NEEDED       Objective:   Physical Exam BP 120/80   Pulse 68   Temp 98.1 F (36.7 C) (Oral)   Resp 16   Ht 5' 10 (1.778 m)   Wt 225 lb 2 oz (102.1 kg)   SpO2 96%   BMI 32.30 kg/m  General:   Well developed, NAD, BMI noted. HEENT:  Normocephalic . Face symmetric, atraumatic Lungs:  CTA B Normal respiratory effort, no intercostal retractions, no accessory muscle use. Heart: RRR,  no murmur.  Lower extremities: no pretibial edema bilaterally  Skin: Not pale. Not jaundice Neurologic:  alert & oriented X3.  Speech normal, gait appropriate for age and unassisted Psych--  Cognition and judgment appear intact.  Cooperative with normal attention span and concentration.  Behavior appropriate. No anxious or depressed appearing.      Assessment   Assessment High cholesterol ADD Increased LFTs: Hep C and B  serology (-) 10-2015. 12-2016: wnl a-1-antitrypsin/ceruloplasmine US  11/2023: Hepatic steatosis  PLAN: ADD: Last UDS low risk, PDMP okay, prescriptions sent ED: See last visit, started sildenafil  with good results, RF as needed Increased LFT, hepatic steatosis: US  from 11/2023 showed hepatic steatosis, the dx was d/w patient.  He is not morbidly obese, he drinks daily, more than 1 serving , that is the likely etiology.  Abstinence is the key treatment. Consider a FibroScan when he comes back. Preventive care: Flu shot today, COVID booster recommended. RTC 11/2024 CPX.

## 2024-07-12 ENCOUNTER — Encounter: Payer: Self-pay | Admitting: Internal Medicine

## 2024-07-12 DIAGNOSIS — K760 Fatty (change of) liver, not elsewhere classified: Secondary | ICD-10-CM | POA: Insufficient documentation

## 2024-07-12 NOTE — Assessment & Plan Note (Signed)
 ADD: Last UDS low risk, PDMP okay, prescriptions sent ED: See last visit, started sildenafil  with good results, RF as needed Increased LFT, hepatic steatosis: US  from 11/2023 showed hepatic steatosis, the dx was d/w patient.  He is not morbidly obese, he drinks daily, more than 1 serving , that is the likely etiology.  Abstinence is the key treatment. Consider a FibroScan when he comes back. Preventive care: Flu shot today, COVID booster recommended. RTC 11/2024 CPX.

## 2024-09-07 ENCOUNTER — Other Ambulatory Visit: Payer: Self-pay | Admitting: Family

## 2024-10-31 ENCOUNTER — Other Ambulatory Visit: Payer: Self-pay | Admitting: Internal Medicine
